# Patient Record
Sex: Female | Born: 1975 | Marital: Single | Smoking: Never smoker
Health system: Southern US, Community
[De-identification: ages and names within clinical notes are randomized; demographics above are authoritative.]

## PROBLEM LIST (undated history)

## (undated) DIAGNOSIS — I208 Other forms of angina pectoris: Secondary | ICD-10-CM

## (undated) DIAGNOSIS — E7800 Pure hypercholesterolemia, unspecified: Secondary | ICD-10-CM

## (undated) DIAGNOSIS — F419 Anxiety disorder, unspecified: Secondary | ICD-10-CM

## (undated) DIAGNOSIS — K760 Fatty (change of) liver, not elsewhere classified: Secondary | ICD-10-CM

## (undated) HISTORY — PX: APPENDECTOMY: SHX54

## (undated) HISTORY — DX: Fatty (change of) liver, not elsewhere classified: K76.0

## (undated) HISTORY — DX: Pure hypercholesterolemia, unspecified: E78.00

## (undated) HISTORY — DX: Anxiety disorder, unspecified: F41.9

## (undated) HISTORY — PX: MANDIBLE FRACTURE SURGERY: SHX706

## (undated) HISTORY — PX: OTHER SURGICAL HISTORY: SHX169

---
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
## 2015-09-05 ENCOUNTER — Other Ambulatory Visit: Payer: Self-pay | Admitting: Neurology

## 2015-09-05 ENCOUNTER — Other Ambulatory Visit: Payer: Self-pay | Admitting: Oral Surgery

## 2015-09-05 DIAGNOSIS — M2740 Unspecified cyst of jaw: Secondary | ICD-10-CM

## 2015-09-12 ENCOUNTER — Ambulatory Visit
Admission: RE | Admit: 2015-09-12 | Discharge: 2015-09-12 | Disposition: A | Discharge status: Home / Self Care | Payer: No Typology Code available for payment source | Source: Ambulatory Visit | Attending: Oral Surgery | Admitting: Oral Surgery

## 2015-09-12 DIAGNOSIS — M2740 Unspecified cyst of jaw: Secondary | ICD-10-CM

## 2015-09-13 ENCOUNTER — Ambulatory Visit: Payer: Self-pay | Attending: Family Medicine

## 2019-03-12 ENCOUNTER — Other Ambulatory Visit: Payer: Self-pay | Admitting: Nurse Practitioner

## 2019-03-12 ENCOUNTER — Ambulatory Visit
Admission: RE | Admit: 2019-03-12 | Discharge: 2019-03-12 | Disposition: A | Discharge status: Home / Self Care | Payer: No Typology Code available for payment source | Source: Ambulatory Visit | Attending: Nurse Practitioner | Admitting: Nurse Practitioner

## 2019-03-12 ENCOUNTER — Other Ambulatory Visit: Payer: Self-pay

## 2019-03-12 DIAGNOSIS — R0602 Shortness of breath: Secondary | ICD-10-CM

## 2019-07-02 ENCOUNTER — Telehealth: Payer: Self-pay

## 2019-07-02 ENCOUNTER — Other Ambulatory Visit: Payer: Self-pay

## 2019-07-02 ENCOUNTER — Encounter: Payer: Self-pay | Admitting: Pulmonary Disease

## 2019-07-02 ENCOUNTER — Ambulatory Visit (INDEPENDENT_AMBULATORY_CARE_PROVIDER_SITE_OTHER): Payer: Self-pay | Admitting: Pulmonary Disease

## 2019-07-02 VITALS — BP 118/88 | HR 90 | Temp 98.0°F | Ht 65.0 in | Wt 181.4 lb

## 2019-07-02 DIAGNOSIS — F410 Panic disorder [episodic paroxysmal anxiety] without agoraphobia: Secondary | ICD-10-CM

## 2019-07-02 DIAGNOSIS — R0602 Shortness of breath: Secondary | ICD-10-CM

## 2019-07-02 DIAGNOSIS — Z8616 Personal history of COVID-19: Secondary | ICD-10-CM

## 2019-07-02 DIAGNOSIS — Z8619 Personal history of other infectious and parasitic diseases: Secondary | ICD-10-CM

## 2019-07-02 NOTE — Telephone Encounter (Signed)
LMTCB  No records have been received. We need to confirm when she was last tested.
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
## 2019-07-02 NOTE — Patient Instructions (Addendum)
Thank you for visiting Dr. Valeta Harms at [facility]. Today we recommend the following:  Orders Placed This Encounter  Procedures  . HRCT ILD - CT CHEST HIGH RESOLUTION  . FULL - Pulmonary Function Test ([facility]BPU)  Return in about 2 months (around 09/01/2019) for with APP or Dr. Valeta Harms.  Please call with any questions.  Please follow up with primary care, Michail Jewels, NP regarding anxiety management     Please do your part to reduce the spread of COVID-19.

## 2019-07-03 ENCOUNTER — Ambulatory Visit (INDEPENDENT_AMBULATORY_CARE_PROVIDER_SITE_OTHER)
Admission: RE | Admit: 2019-07-03 | Discharge: 2019-07-03 | Disposition: A | Discharge status: Home / Self Care | Payer: Self-pay | Source: Ambulatory Visit | Attending: Pulmonary Disease | Admitting: Pulmonary Disease

## 2019-07-03 ENCOUNTER — Encounter: Payer: Self-pay | Admitting: Radiology

## 2019-07-03 DIAGNOSIS — R0602 Shortness of breath: Secondary | ICD-10-CM

## 2019-07-08 ENCOUNTER — Telehealth: Payer: Self-pay | Admitting: Internal Medicine

## 2019-07-08 NOTE — Progress Notes (Signed)
Called and LMTCB via [employer]

## 2019-07-08 NOTE — Telephone Encounter (Signed)
  Advised pt of results. Pt understood and nothing further is needed.    Notes recorded by Garner Nash, DO on 07/03/2019 at 5:06 PM EST  [place], please let her know that her CT scan imaging is normal.   Thanks,   BLI   Garner Nash, DO  [facility]  07/03/2019 5:04 PM

## 2019-07-08 NOTE — Telephone Encounter (Signed)
eRROR 

## 2019-07-14 ENCOUNTER — Other Ambulatory Visit (HOSPITAL_COMMUNITY)
Admission: RE | Admit: 2019-07-14 | Discharge: 2019-07-14 | Disposition: A | Discharge status: Home / Self Care | Payer: Self-pay | Source: Ambulatory Visit | Attending: Pulmonary Disease | Admitting: Pulmonary Disease

## 2019-07-14 DIAGNOSIS — Z01812 Encounter for preprocedural laboratory examination: Secondary | ICD-10-CM | POA: Insufficient documentation

## 2019-07-14 DIAGNOSIS — Z20828 Contact with and (suspected) exposure to other viral communicable diseases: Secondary | ICD-10-CM | POA: Insufficient documentation

## 2019-07-14 LAB — SARS CORONAVIRUS 2 (TAT 6-24 HRS): SARS Coronavirus 2: NEGATIVE

## 2019-07-16 ENCOUNTER — Ambulatory Visit (INDEPENDENT_AMBULATORY_CARE_PROVIDER_SITE_OTHER): Payer: Self-pay | Admitting: Pulmonary Disease

## 2019-07-16 ENCOUNTER — Other Ambulatory Visit: Payer: Self-pay

## 2019-07-16 DIAGNOSIS — R0602 Shortness of breath: Secondary | ICD-10-CM

## 2019-07-16 DIAGNOSIS — Z8619 Personal history of other infectious and parasitic diseases: Secondary | ICD-10-CM

## 2019-07-16 DIAGNOSIS — Z8616 Personal history of COVID-19: Secondary | ICD-10-CM

## 2019-07-16 LAB — PULMONARY FUNCTION TEST
DL/VA % pred: 135 %
DL/VA: 5.86 ml/min/mmHg/L
DLCO unc % pred: 127 %
DLCO unc: 29.27 ml/min/mmHg
FEF 25-75 Post: 4.33 L/sec
FEF 25-75 Pre: 3.4 L/sec
FEF2575-%Change-Post: 27 %
FEF2575-%Pred-Post: 139 %
FEF2575-%Pred-Pre: 109 %
FEV1-%Change-Post: 5 %
FEV1-%Pred-Post: 103 %
FEV1-%Pred-Pre: 98 %
FEV1-Post: 3.25 L
FEV1-Pre: 3.07 L
FEV1FVC-%Change-Post: 4 %
FEV1FVC-%Pred-Pre: 100 %
FEV6-%Change-Post: 0 %
FEV6-%Pred-Post: 99 %
FEV6-%Pred-Pre: 98 %
FEV6-Post: 3.79 L
FEV6-Pre: 3.77 L
FEV6FVC-%Change-Post: 0 %
FEV6FVC-%Pred-Post: 101 %
FEV6FVC-%Pred-Pre: 102 %
FVC-%Change-Post: 0 %
FVC-%Pred-Post: 97 %
FVC-%Pred-Pre: 96 %
FVC-Post: 3.8 L
FVC-Pre: 3.77 L
Post FEV1/FVC ratio: 86 %
Post FEV6/FVC ratio: 100 %
Pre FEV1/FVC ratio: 82 %
Pre FEV6/FVC Ratio: 100 %

## 2019-07-16 NOTE — Progress Notes (Signed)
Spirometry pre and post and Dlco done today. ?

## 2019-07-16 NOTE — Telephone Encounter (Signed)
This has been addressed. Nothing further is needed at this time.  

## 2019-08-28 ENCOUNTER — Ambulatory Visit: Payer: Self-pay | Admitting: Pulmonary Disease

## 2020-02-02 ENCOUNTER — Telehealth: Payer: Self-pay | Admitting: Pulmonary Disease

## 2020-02-02 NOTE — Telephone Encounter (Signed)
Pt was contacted and an appt has been scheduled

## 2020-02-03 ENCOUNTER — Other Ambulatory Visit: Payer: Self-pay

## 2020-02-03 ENCOUNTER — Ambulatory Visit (INDEPENDENT_AMBULATORY_CARE_PROVIDER_SITE_OTHER): Payer: Self-pay | Admitting: Nurse Practitioner

## 2020-02-03 VITALS — HR 98 | Temp 97.6°F | Resp 18

## 2020-02-03 DIAGNOSIS — Z8619 Personal history of other infectious and parasitic diseases: Secondary | ICD-10-CM

## 2020-02-03 DIAGNOSIS — R29898 Other symptoms and signs involving the musculoskeletal system: Secondary | ICD-10-CM

## 2020-02-03 DIAGNOSIS — R519 Headache, unspecified: Secondary | ICD-10-CM

## 2020-02-03 DIAGNOSIS — F411 Generalized anxiety disorder: Secondary | ICD-10-CM

## 2020-02-03 DIAGNOSIS — G8929 Other chronic pain: Secondary | ICD-10-CM

## 2020-02-03 DIAGNOSIS — R0602 Shortness of breath: Secondary | ICD-10-CM

## 2020-02-03 DIAGNOSIS — R251 Tremor, unspecified: Secondary | ICD-10-CM

## 2020-02-03 MED ORDER — BUDESONIDE-FORMOTEROL FUMARATE 80-4.5 MCG/ACT IN AERO: 2.0000 | INHALATION_SPRAY | Freq: Two times a day (BID) | RESPIRATORY_TRACT | 3 refills | Status: AC

## 2020-02-03 MED ORDER — DULOXETINE HCL 30 MG PO CPEP: 30.0000 mg | ORAL_CAPSULE | Freq: Every day | ORAL | 3 refills | Status: DC

## 2020-02-03 NOTE — Patient Instructions (Addendum)
History of Viral Illness - most likely Covid - never tested Shortness of breath:  Will need follow up with pulmonary  Will order Symbicort - 2 puffs twice daily   Headache Weakness to lower extremities Tremors:  Will refer to neurology  Will refer to Neuro Rehab   Anxiety:  Will start on cymbalta   Note: Patient does not have insurance - will give paper work for financial assistance  Follow up:  Follow up in 1 month or sooner if needed

## 2020-02-03 NOTE — Progress Notes (Addendum)
@Patient  ID: Sandra Savage, female    DOB: Sep 18, 1975, 44 y.o.   MRN: [id number]  Chief Complaint  Patient presents with  . Shortness of Breath    thinks she had covid June 2020 - SOB, weakness, tremor, HA,     Referring provider: Garner Nash, DO   44 year old female with history of anxiety and hypercholesteremia.   HPI  Patient presents today for post Covid care clinic visit.  Spanish interpreter was used for the visit.  Patient states that she had Covid in June 2020.  She was never tested for Covid at that time.  There were 3 other family members who were sick with Covid at the same time.  Patient states that since that time she has been having ongoing issues with shortness of breath with exertion, weakness to her bilateral lower extremities, headaches, throbbing pressure to brain/head, and tremors.  Patient states that at times it feels like her legs give away with her and she feels like she is going to fall.  Patient has been seen by pulmonary but did not follow-up.  Patient does have financial concerns and will be given a financial assistance packet today. Denies f/c/s, n/v/d, hemoptysis, PND, chest pain or edema.   Note: Patient walked in office today - O2 sats remained above 98% for entire walk and heart rate was stable.      No Known Allergies   There is no immunization history on file for this patient.  Past Medical History:  Diagnosis Date  . Angina at rest [facility])   . Anxiety   . Hypercholesteremia     Tobacco History: Social History   Tobacco Use  Smoking Status Never Smoker  Smokeless Tobacco Never Used   Counseling given: Not Answered   Outpatient Encounter Medications as of 02/03/2020  Medication Sig  . budesonide-formoterol (SYMBICORT) 80-4.5 MCG/ACT inhaler Inhale 2 puffs into the lungs 2 (two) times daily.  . Cyanocobalamin (B-12 COMPLIANCE INJECTION) 1000 MCG/ML KIT Inject as directed once a week.  . DULoxetine (CYMBALTA) 30 MG capsule Take 1  capsule (30 mg total) by mouth daily.  . hydrOXYzine (ATARAX/VISTARIL) 25 MG tablet Take 25 mg by mouth at bedtime as needed.  . NON FORMULARY Take 500 mg by mouth. 577m CBD Gummie   No facility-administered encounter medications on file as of 02/03/2020.     Review of Systems  Review of Systems  Constitutional: Negative.  Negative for chills and fever.  HENT: Negative.   Respiratory: Positive for shortness of breath. Negative for cough.   Cardiovascular: Negative.  Negative for chest pain, palpitations and leg swelling.  Gastrointestinal: Negative.   Allergic/Immunologic: Negative.   Neurological: Positive for tremors, weakness (bilateral lower extremities) and headaches.  Psychiatric/Behavioral: Negative.        Physical Exam  Pulse 98   Temp 97.6 F (36.4 C)   Resp 18   SpO2 99% Comment: RA  Wt Readings from Last 5 Encounters:  07/02/19 181 lb 6.4 oz (82.3 kg)     Physical Exam Vitals and nursing note reviewed.  Constitutional:      General: She is not in acute distress.    Appearance: She is well-developed.  Cardiovascular:     Rate and Rhythm: Normal rate and regular rhythm.  Pulmonary:     Effort: Pulmonary effort is normal.     Breath sounds: Normal breath sounds.  Musculoskeletal:     Right lower leg: No edema.     Left lower leg:  No edema.  Neurological:     Mental Status: She is alert and oriented to person, place, and time.     Comments: Tremor to bilateral hands noted      Assessment & Plan:   History of viral illness History of Viral Illness - most likely Covid - never tested Shortness of breath:  Will need follow up with pulmonary  Will order Symbicort - 2 puffs twice daily   Headache Weakness to lower extremities Tremors:  Will refer to neurology  Will refer to Neuro Rehab   Anxiety:  Will start on cymbalta   Note: Patient does not have insurance - will give paper work for financial assistance  Follow up:  Follow up  in 1 month or sooner if needed      Fenton Foy, NP 02/04/2020

## 2020-02-04 DIAGNOSIS — R29898 Other symptoms and signs involving the musculoskeletal system: Secondary | ICD-10-CM | POA: Insufficient documentation

## 2020-02-04 DIAGNOSIS — R0602 Shortness of breath: Secondary | ICD-10-CM | POA: Insufficient documentation

## 2020-02-04 DIAGNOSIS — F411 Generalized anxiety disorder: Secondary | ICD-10-CM | POA: Insufficient documentation

## 2020-02-04 DIAGNOSIS — R519 Headache, unspecified: Secondary | ICD-10-CM | POA: Insufficient documentation

## 2020-02-04 DIAGNOSIS — R251 Tremor, unspecified: Secondary | ICD-10-CM | POA: Insufficient documentation

## 2020-02-04 DIAGNOSIS — Z8619 Personal history of other infectious and parasitic diseases: Secondary | ICD-10-CM | POA: Insufficient documentation

## 2020-02-04 DIAGNOSIS — G8929 Other chronic pain: Secondary | ICD-10-CM | POA: Insufficient documentation

## 2020-02-04 NOTE — Assessment & Plan Note (Signed)
History of Viral Illness - most likely Covid - never tested Shortness of breath:  Will need follow up with pulmonary  Will order Symbicort - 2 puffs twice daily   Headache Weakness to lower extremities Tremors:  Will refer to neurology  Will refer to Neuro Rehab   Anxiety:  Will start on cymbalta   Note: Patient does not have insurance - will give paper work for financial assistance  Follow up:  Follow up in 1 month or sooner if needed 

## 2020-02-08 ENCOUNTER — Other Ambulatory Visit: Payer: Self-pay

## 2020-02-08 ENCOUNTER — Encounter: Payer: Self-pay | Admitting: Pulmonary Disease

## 2020-02-08 ENCOUNTER — Ambulatory Visit: Payer: Self-pay | Attending: Nurse Practitioner | Admitting: Rehabilitation

## 2020-02-08 ENCOUNTER — Encounter: Payer: Self-pay | Admitting: Rehabilitation

## 2020-02-08 DIAGNOSIS — R2681 Unsteadiness on feet: Secondary | ICD-10-CM | POA: Insufficient documentation

## 2020-02-08 DIAGNOSIS — M6281 Muscle weakness (generalized): Secondary | ICD-10-CM | POA: Insufficient documentation

## 2020-02-08 DIAGNOSIS — R209 Unspecified disturbances of skin sensation: Secondary | ICD-10-CM | POA: Insufficient documentation

## 2020-02-08 DIAGNOSIS — R2689 Other abnormalities of gait and mobility: Secondary | ICD-10-CM | POA: Insufficient documentation

## 2020-02-08 NOTE — Therapy (Signed)
[facility] [address] [place], [place], [address] Phone: [phone]   Fax:  [phone]  Physical Therapy Evaluation  Patient Details  Name: Sandra Savage MRN: [id number] Date of Birth: 1976/06/27 Referring Provider (PT): Lazaro Arms, NP   Encounter Date: 02/08/2020   PT End of Session - 02/08/20 1359    Visit Number 1    Number of Visits 17   She may only do 1x/wk   Date for PT Re-Evaluation 04/08/20    Authorization Type Financial assistance application given (awaiting approval)    PT Start Time 1113    PT Stop Time 1152    PT Time Calculation (min) 39 min    Activity Tolerance Patient limited by fatigue    Behavior During Therapy [facility] for tasks assessed/performed           Past Medical History:  Diagnosis Date  . Angina at rest [facility])   . Anxiety   . Hypercholesteremia     Past Surgical History:  Procedure Laterality Date  . appendectomy     unknown date per patient   . MANDIBLE FRACTURE SURGERY     domestic     There were no vitals filed for this visit.    Subjective Assessment - 02/08/20 1118    Subjective Per interpreter: "I had Covid last year but I feel my brain pulses or throbs.  I have some tremors in hands, sometimes in left leg, but weakness in both legs.  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  I am desperate."    Patient is accompained by: Interpreter    Pertinent History post covid    Limitations Walking;Standing;Lifting    How long can you stand comfortably? sometimes 10 mins, sometimes an hour, fluctuates    How long can you walk comfortably? about 10 mins    Patient Stated Goals "I want the tremors to go away, go back to work, to be able to recover"    Currently in Pain? No/denies              [facility] PT Assessment - 02/08/20 0001      Assessment   Medical Diagnosis Post Covid debility    Referring Provider (PT) Lazaro Arms, NP    Onset Date/Surgical Date 01/04/19   end of May and beginning of July 2020 (neither confirmed)     Precautions   Precautions Fall      Balance Screen   Has the patient fallen in the past 6 months No    Has the patient had a decrease in activity level because of a fear of falling?  Yes    Is the patient reluctant to leave their home because of a fear of falling?  Yes      [place] Private residence    Living Arrangements Alone;Other relatives   Cousins are in [place] to enter    Entrance Stairs-Number of Steps --   third floor apt, so 40ish stairs   Entrance Stairs-Rails Right;Left;Cannot reach both    Home Layout One level    Home Equipment None      Prior Function   Level of Independence Independent with basic ADLs;Independent with household mobility with device;Independent with household mobility without device;Independent with gait;Independent with transfers    Vocation Full time  employment    ArtistVocation Requirements Stylist     Leisure Movies, going dancing, going to the park, go to the gym      Cognition   Overall Cognitive Status Impaired/Different from baseline    Area of Impairment Memory   concentration, word finding     Sensation   Light Touch Impaired Detail    Light Touch Impaired Details Impaired RUE;Impaired LUE;Impaired RLE;Impaired LLE   knee down in LEs, whole arm   Hot/Cold Appears Intact   cannot discern temp on head     Coordination   Gross Motor Movements are Fluid and Coordinated Yes   in LEs   Fine Motor Movements are Fluid and Coordinated Yes   In LEs   Heel Shin [facility]      Posture/Postural Control   Posture/Postural Control Postural limitations    Postural Limitations Rounded Shoulders;Forward head      ROM / Strength   AROM / PROM / Strength Strength      Strength   Overall Strength Deficits    Overall Strength Comments hip flex 4/5 B, L  knee ext 3+/5, R knee ext 4/5, B knee flex 4/5, B ankle DF 4/5, B ankle PF 4/5 (heel raises B)      Transfers   Transfers Sit to Stand;Stand to Sit    Sit to Stand 6: Modified independent (Device/Increase time)    Five time sit to stand comments  16.56 secs with hands on lap x 3 reps    Stand to Sit 6: Modified independent (Device/Increase time)      Ambulation/Gait   Ambulation/Gait Yes    Ambulation/Gait Assistance 6: Modified independent (Device/Increase time)    Ambulation Distance (Feet) 150 Feet    Assistive device None    Gait Pattern Within Functional Limits    Ambulation Surface Level;Indoor    Gait velocity --   unable to obtain on eval   Stairs Yes    Stairs Assistance 6: Modified independent (Device/Increase time)    Stair Management Technique One rail Right;Alternating pattern;Forwards    Number of Stairs 4    Height of Stairs 6    Gait Comments Pts SaO2 following FGA was 100% with HR at 87      Functional Gait  Assessment   Gait assessed  Yes    Gait Level Surface Walks 20 ft in less than 7 sec but greater than 5.5 sec, uses assistive device, slower speed, mild gait deviations, or deviates 6-10 in outside of the 12 in walkway width.    Change in Gait Speed Able to smoothly change walking speed without loss of balance or gait deviation. Deviate no more than 6 in outside of the 12 in walkway width.    Gait with Horizontal Head Turns Performs head turns smoothly with no change in gait. Deviates no more than 6 in outside 12 in walkway width    Gait with Vertical Head Turns Performs head turns with no change in gait. Deviates no more than 6 in outside 12 in walkway width.    Gait and Pivot Turn Pivot turns safely within 3 sec and stops quickly with no loss of balance.    Step Over Obstacle Is able to step over one shoe box (4.5 in total height) without changing gait speed. No evidence of imbalance.    Gait with Narrow Base of Support Ambulates 4-7 steps.    Gait with Eyes  Closed Walks 20 ft, slow speed, abnormal gait pattern, evidence  for imbalance, deviates 10-15 in outside 12 in walkway width. Requires more than 9 sec to ambulate 20 ft.    Ambulating Backwards Walks 20 ft, uses assistive device, slower speed, mild gait deviations, deviates 6-10 in outside 12 in walkway width.    Steps Alternating feet, must use rail.    Total Score 22    FGA comment: 19-24 = medium risk fall                      Objective measurements completed on examination: See above findings.               PT Education - 02/08/20 1359    Education Details education on evaluation results, POC, goals    Person(s) Educated Patient   through interpreter   Methods Explanation    Comprehension Verbalized understanding            PT Short Term Goals - 02/08/20 1504      PT SHORT TERM GOAL #1   Title Pt will be IND with initial HEP in order to indicate improved function and decreased fall risk.  (Target Date: 03/09/20)    Baseline dependent    Time 4    Period Weeks    Status New    Target Date 03/09/20      PT SHORT TERM GOAL #2   Title Pt will ambulate at mod I level without AD with gait speed of >/=2.62 ft/sec in order to indicate safe community ambulator.    Baseline did not have time to assess    Time 4    Period Weeks    Status New      PT SHORT TERM GOAL #3   Title Pt will improve 5TSS time to </=13 secs without UE support in order to indicate dec fall risk and improved strength.    Baseline 16.56 secs with light UE support on thighs    Time 4    Period Weeks    Status New      PT SHORT TERM GOAL #4   Title Pt will perform and improve distance by 150' in order to indicate improved functional endurance.    Baseline unable to assess    Time 4    Period Weeks    Status New      PT SHORT TERM GOAL #5   Title Pt will improve FGA to >/=25/30 in order to indicate decreased fall risk.    Baseline 22/30    Time 4    Period Weeks    Status  New             PT Long Term Goals - 02/08/20 1508      PT LONG TERM GOAL #1   Title Pt will be IND with final HEP in order to indicate improved functional mobility and balance (Target Date: 04/08/20)    Baseline dependent    Time 8    Period Weeks    Status New    Target Date 04/08/20      PT LONG TERM GOAL #2   Title Pt will ambulate with gait speed >/=3.22 ft/sec in order to indicate more normal gait speed.    Baseline did not have time to assess    Time 8    Period Weeks    Status New      PT LONG TERM GOAL #3   Title Pt will improve distance by 300' from baseline in order to  indicate improved functional endurnace.    Baseline unable to assess on eval    Time 8    Period Weeks    Status New      PT LONG TERM GOAL #4   Title Pt will negotiate up/down 3 flights of stairs with single rail in alternating pattern with no more than 13/20 on RPE scale in order to indicate improved strength and endurance.    Baseline Pt is currently exhausted and needs several breaks when doing stairs.    Time 8    Period Weeks    Status New      PT LONG TERM GOAL #5   Title Pt will improve FGA to >/=28/30 in order to indicate decreased fall risk.    Baseline 22/30    Time 8    Period Weeks    Status New      Additional Long Term Goals   Additional Long Term Goals Yes      PT LONG TERM GOAL #6   Title Pt will tolerate light to moderate standing activity x 30 mins in order to indicate improved endurance and ability to transition back to work.    Baseline pt is not working at this time    Time 8    Period Weeks    Status New                  Plan - 02/08/20 1400    Clinical Impression Statement Pt presents post Covid diagnosis in May of last year then again in June.  Neither case was actually confirmed that I can find in her medical history and last MD note reports that it was not confirmed.  She reports that family members also had Covid around this time.  She presents  with continued weakness, tremors in BUEs/LLE, decreased balance, and poor activity tolerance.  She has not major medical history except for high cholesterol.  Upon PT evaluation, pt demos generalized BLE weakness (esp in hip flexors), decreased gait speed, FGA of 22/30 indicative of moderate fall risk and 5TSS time of 16.56 ses with light UE support indicative of elevated fall risk.  Pt very SOB following gait tasks, however SaO2 was 100% and HR was 87.  Pt will benefit from skilled OP neuro PT in order to address deficits.    Personal Factors and Comorbidities Age;Comorbidity 1;Finances;Time since onset of injury/illness/exacerbation;Profession    Comorbidities post covid    Examination-Activity Limitations Carry;Lift;Stand;Stairs;Squat;Locomotion Level;Transfers    Examination-Participation Restrictions Cleaning;Community Activity;Shop;Meal Prep    Stability/Clinical Decision Making Evolving/Moderate complexity    Clinical Decision Making Moderate    Rehab Potential Good    PT Frequency 2x / week   she may only choose to do 1x.wk   PT Duration 8 weeks    PT Treatment/Interventions ADLs/Self Care Home Management;Aquatic Therapy;DME Instruction;Gait training;Stair training;Functional mobility training;Therapeutic activities;Therapeutic exercise;Balance training;Neuromuscular re-education;Cognitive remediation;Patient/family education;Passive range of motion;Vestibular    PT Next Visit Plan get gait speed (update goal as needed), (update goals if needed)-initiate walking program, initiate HEP for BLE strength, balance    Recommended Other Services I wonder about an OT eval for UE deficits however am unsure since she is awaiting financial assistance?? what do you think??    Consulted and Agree with Plan of Care Patient;Other (Comment)   interpreter          Patient will benefit from skilled therapeutic intervention in order to improve the following deficits and impairments:  Cardiopulmonary  status limiting  activity, Decreased activity tolerance, Decreased balance, Decreased cognition, Decreased endurance, Decreased mobility, Decreased strength, Difficulty walking, Impaired perceived functional ability, Impaired flexibility, Impaired sensation, Postural dysfunction, Improper body mechanics  Visit Diagnosis: Muscle weakness (generalized)  Other abnormalities of gait and mobility  Unspecified disturbances of skin sensation  Unsteadiness on feet     Problem List Patient Active Problem List   Diagnosis Date Noted  . Shortness of breath 02/04/2020  . Generalized anxiety disorder 02/04/2020  . Weakness of both lower extremities 02/04/2020  . Chronic nonintractable headache 02/04/2020  . Tremor of both hands 02/04/2020  . History of viral illness 02/04/2020    Harriet Butte, PT, MPT [facility] [address] [place], [place], [address] Phone: [phone]   Fax:  [phone] 02/08/20, 3:16 PM  Name: Sandra Savage MRN: [id number] Date of Birth: [birth year]

## 2020-02-11 DIAGNOSIS — B342 Coronavirus infection, unspecified: Secondary | ICD-10-CM

## 2020-02-11 HISTORY — DX: Coronavirus infection, unspecified: B34.2

## 2020-02-17 ENCOUNTER — Other Ambulatory Visit: Payer: Self-pay

## 2020-02-17 ENCOUNTER — Encounter: Payer: Self-pay | Admitting: Rehabilitation

## 2020-02-17 ENCOUNTER — Ambulatory Visit: Payer: Self-pay | Attending: Nurse Practitioner | Admitting: Rehabilitation

## 2020-02-17 DIAGNOSIS — M6281 Muscle weakness (generalized): Secondary | ICD-10-CM | POA: Insufficient documentation

## 2020-02-17 DIAGNOSIS — R2681 Unsteadiness on feet: Secondary | ICD-10-CM | POA: Insufficient documentation

## 2020-02-17 DIAGNOSIS — R209 Unspecified disturbances of skin sensation: Secondary | ICD-10-CM | POA: Insufficient documentation

## 2020-02-17 DIAGNOSIS — R2689 Other abnormalities of gait and mobility: Secondary | ICD-10-CM | POA: Insufficient documentation

## 2020-02-17 NOTE — Therapy (Signed)
[facility] [address] [place], [place], [address] Phone: [phone]   Fax:  [phone]  Physical Therapy Treatment  Patient Details  Name: Sandra Savage MRN: [id number] Date of Birth: 03-29-1976 Referring Provider (PT): Angus Seller, NP   Encounter Date: 02/17/2020   PT End of Session - 02/17/20 1024    Visit Number 2    Number of Visits 17    Date for PT Re-Evaluation 04/08/20    Authorization Type Financial assistance application given (awaiting approval)    PT Start Time [phone]    PT Stop Time 0930    PT Time Calculation (min) 44 min    Activity Tolerance Patient limited by fatigue    Behavior During Therapy [facility] for tasks assessed/performed           Past Medical History:  Diagnosis Date   Angina at rest [facility])    Anxiety    Hypercholesteremia     Past Surgical History:  Procedure Laterality Date   appendectomy     unknown date per patient    MANDIBLE FRACTURE SURGERY     domestic     There were no vitals filed for this visit.   Subjective Assessment - 02/17/20 0848    Subjective No changes to report via interpreter.    Pertinent History post covid    Limitations Walking;Standing;Lifting    How long can you stand comfortably? sometimes 10 mins, sometimes an hour, fluctuates    How long can you walk comfortably? about 10 mins    Patient Stated Goals "I want the tremors to go away, go back to work, to be able to recover"    Currently in Pain? No/denies              [facility] PT Assessment - 02/17/20 0851      Ambulation/Gait   Ambulation/Gait Yes    Ambulation/Gait Assistance 6: Modified independent (Device/Increase time)    Ambulation Distance (Feet) 1237 Feet   plus 100' within session   Assistive device None    Gait Pattern Within Functional Limits    Ambulation Surface Level;Indoor    Gait velocity 3.46 ft/sec without AD      6 Minute Walk- Baseline   6 Minute Walk- Baseline  yes    BP (mmHg) 127/76    HR (bpm) 85    02 Sat (%RA) 97 %    Modified Borg Scale for Dyspnea 3- Moderate shortness of breath or breathing difficulty    Perceived Rate of Exertion (Borg) 11- Fairly light      6 Minute walk- Post Test   6 Minute Walk Post Test yes    BP (mmHg) 101/78    HR (bpm) 104    02 Sat (%RA) 95 %    Modified Borg Scale for Dyspnea 4- somewhat severe    Perceived Rate of Exertion (Borg) 19-Very, very hard      6 minute walk test results    Aerobic Endurance Distance Walked 1237    Endurance additional comments without AD, without rest break, some dizziness noted       High Level Balance   High Level Balance Comments see pt instructions for exercises             Walking Program:  Educated to begin with bouts of 4-5 mins (timing) indoors vs outdoors (when cooler and on flat/paved surfaces if able) and increasing time 1 min over 1 week period as tolerated.  Also educated on  landmarks if that is easier.  For example, 1-2 trips to mailbox made me feel how I felt post 6MWT or close, then begin there and work her way up.  Pt verbalized understanding via interpreter.      Access Code: [email or web address] URL: [email or web address] Date: 02/17/2020 Prepared by: Harriet ButteEmily Francy Mcilvaine  Exercises Seated Shoulder Rolls - 1 x daily - 5-7 x weekly - 2 sets - 10 reps Walking March - 1 x daily - 7 x weekly - 1 sets - 4 reps Tandem Walking with Counter Support - 1 x daily - 7 x weekly - 1 sets - 4 reps Mini Squat with Counter Support - 1 x daily - 7 x weekly - 1 sets - 10 reps               PT Education - 02/17/20 1024    Education Details HEP, walking program    Person(s) Educated Patient   via ipad interpreter   Methods Explanation;Demonstration;Handout    Comprehension Verbalized understanding;Returned demonstration;Need further instruction            PT Short Term Goals - 02/08/20 1504      PT SHORT TERM GOAL #1   Title Pt will be IND with  initial HEP in order to indicate improved function and decreased fall risk.  (Target Date: 03/09/20)    Baseline dependent    Time 4    Period Weeks    Status New    Target Date 03/09/20      PT SHORT TERM GOAL #2   Title Pt will ambulate at mod I level without AD with gait speed of >/=2.62 ft/sec in order to indicate safe community ambulator.    Baseline did not have time to assess    Time 4    Period Weeks    Status New      PT SHORT TERM GOAL #3   Title Pt will improve 5TSS time to </=13 secs without UE support in order to indicate dec fall risk and improved strength.    Baseline 16.56 secs with light UE support on thighs    Time 4    Period Weeks    Status New      PT SHORT TERM GOAL #4   Title Pt will perform 6MWT and improve distance by 150' in order to indicate improved functional endurance.    Baseline unable to assess    Time 4    Period Weeks    Status New      PT SHORT TERM GOAL #5   Title Pt will improve FGA to >/=25/30 in order to indicate decreased fall risk.    Baseline 22/30    Time 4    Period Weeks    Status New             PT Long Term Goals - 02/08/20 1508      PT LONG TERM GOAL #1   Title Pt will be IND with final HEP in order to indicate improved functional mobility and balance (Target Date: 04/08/20)    Baseline dependent    Time 8    Period Weeks    Status New    Target Date 04/08/20      PT LONG TERM GOAL #2   Title Pt will ambulate with gait speed >/=3.22 ft/sec in order to indicate more normal gait speed.    Baseline did not have time to assess    Time 8    Period Weeks  Status New      PT LONG TERM GOAL #3   Title Pt will improve distance by 300' from baseline in order to indicate improved functional endurnace.    Baseline unable to assess on eval    Time 8    Period Weeks    Status New      PT LONG TERM GOAL #4   Title Pt will negotiate up/down 3 flights of stairs with single rail in alternating pattern with no more  than 13/20 on RPE scale in order to indicate improved strength and endurance.    Baseline Pt is currently exhausted and needs several breaks when doing stairs.    Time 8    Period Weeks    Status New      PT LONG TERM GOAL #5   Title Pt will improve FGA to >/=28/30 in order to indicate decreased fall risk.    Baseline 22/30    Time 8    Period Weeks    Status New      Additional Long Term Goals   Additional Long Term Goals Yes      PT LONG TERM GOAL #6   Title Pt will tolerate light to moderate standing activity x 30 mins in order to indicate improved endurance and ability to transition back to work.    Baseline pt is not working at this time    Time 8    Period Weeks    Status New                 Plan - 02/17/20 1025    Clinical Impression Statement Skilled session focused on formal assessment of functional endurance via .  Note distance of 1237' without device or seated rest breaks and vitals WFL, however this distance is greatly below normal for her age and also pt with max fatigue and mild dizziness following task.  Also note that gait speed is 3.46 ft/sec which is safe for community ambulator, however below normal for her age group and with fatigue.  Pt needs max cues throughout functional task for pursed lip breathing and also to relax shoulders.  Remainder of session initiated HEP for walking program (via interpreter) and HEP (in [place]).  Pt tolerated all very well.    Personal Factors and Comorbidities Age;Comorbidity 1;Finances;Time since onset of injury/illness/exacerbation;Profession    Comorbidities post covid    Examination-Activity Limitations Carry;Lift;Stand;Stairs;Squat;Locomotion Level;Transfers    Examination-Participation Restrictions Cleaning;Community Activity;Shop;Meal Prep    Stability/Clinical Decision Making Evolving/Moderate complexity    Rehab Potential Good    PT Frequency 2x / week    PT Duration 8 weeks    PT Treatment/Interventions  ADLs/Self Care Home Management;Aquatic Therapy;DME Instruction;Gait training;Stair training;Functional mobility training;Therapeutic activities;Therapeutic exercise;Balance training;Neuromuscular re-education;Cognitive remediation;Patient/family education;Passive range of motion;Vestibular    PT Next Visit Plan See how walking program is going (we talked about doing 4-5 mins for 1-2 weeks or landmarks if outdoors-when not hot (ex to/from mailbox)).  Continue high level balance and endurance (seated stepper/nustep would be good maybe treadmil).    Consulted and Agree with Plan of Care Patient;Other (Comment)           Patient will benefit from skilled therapeutic intervention in order to improve the following deficits and impairments:  Cardiopulmonary status limiting activity, Decreased activity tolerance, Decreased balance, Decreased cognition, Decreased endurance, Decreased mobility, Decreased strength, Difficulty walking, Impaired perceived functional ability, Impaired flexibility, Impaired sensation, Postural dysfunction, Improper body mechanics  Visit Diagnosis: Muscle weakness (generalized)  Other abnormalities of gait and mobility  Unspecified disturbances of skin sensation  Unsteadiness on feet     Problem List Patient Active Problem List   Diagnosis Date Noted   Shortness of breath 02/04/2020   Generalized anxiety disorder 02/04/2020   Weakness of both lower extremities 02/04/2020   Chronic nonintractable headache 02/04/2020   Tremor of both hands 02/04/2020   History of viral illness 02/04/2020    Harriet Butte, PT, MPT [facility] [address] [place], [place], [address] Phone: [phone]   Fax:  [phone] 02/17/20, 10:31 AM  Name: Sandra Savage MRN: [id number] Date of Birth: [birth year]

## 2020-02-17 NOTE — Patient Instructions (Signed)
Access Code: [email or web address] URL: [email or web address] Date: 02/17/2020 Prepared by: Harriet Butte  Exercises Seated Shoulder Rolls - 1 x daily - 5-7 x weekly - 2 sets - 10 reps Walking March - 1 x daily - 7 x weekly - 1 sets - 4 reps Tandem Walking with Counter Support - 1 x daily - 7 x weekly - 1 sets - 4 reps Mini Squat with Counter Support - 1 x daily - 7 x weekly - 1 sets - 10 reps

## 2020-02-24 ENCOUNTER — Encounter: Payer: Self-pay | Admitting: Rehabilitation

## 2020-02-24 ENCOUNTER — Other Ambulatory Visit: Payer: Self-pay

## 2020-02-24 ENCOUNTER — Ambulatory Visit: Payer: Self-pay | Admitting: Rehabilitation

## 2020-02-24 DIAGNOSIS — R2681 Unsteadiness on feet: Secondary | ICD-10-CM

## 2020-02-24 DIAGNOSIS — R2689 Other abnormalities of gait and mobility: Secondary | ICD-10-CM

## 2020-02-24 DIAGNOSIS — R209 Unspecified disturbances of skin sensation: Secondary | ICD-10-CM

## 2020-02-24 DIAGNOSIS — M6281 Muscle weakness (generalized): Secondary | ICD-10-CM

## 2020-02-24 NOTE — Patient Instructions (Signed)
Access Code: [email or web address] URL: [email or web address] Date: 02/24/2020 Prepared by: Harriet Butte  Exercises Seated Shoulder Rolls - 1 x daily - 5-7 x weekly - 2 sets - 10 reps Walking March - 1 x daily - 7 x weekly - 1 sets - 4 reps Tandem Walking with Counter Support - 1 x daily - 7 x weekly - 1 sets - 4 reps Mini Squat with Counter Support - 1 x daily - 7 x weekly - 1 sets - 10 reps Sit to Stand without Arm Support - 1 x daily - 7 x weekly - 3 sets - 10 reps Romberg Stance Eyes Closed on Foam Pad - 1 x daily - 7 x weekly - 3 sets - 10 reps Single Leg Stance on Foam Pad - 1 x daily - 7 x weekly - 3 sets - 10 reps Tandem Stance on Foam Pad with Eyes Open - 1 x daily - 7 x weekly - 3 sets - 10 reps

## 2020-02-24 NOTE — Therapy (Signed)
[facility], [place] [address] [place], [place], [address] Phone: [phone]   Fax:  [phone]  Physical Therapy Treatment  Patient Details  Name: Sandra Savage MRN: [id number] Date of Birth: 29-Jun-1976 Referring Provider (PT): Angus Seller, NP   Encounter Date: 02/24/2020   PT End of Session - 02/24/20 1025    Visit Number 3    Number of Visits 17    Date for PT Re-Evaluation 04/08/20    Authorization Type Financial assistance application given (awaiting approval)    PT Start Time 1019    PT Stop Time 1100    PT Time Calculation (min) 41 min    Activity Tolerance Patient limited by fatigue    Behavior During Therapy [facility] for tasks assessed/performed           Past Medical History:  Diagnosis Date  . Angina at rest [facility])   . Anxiety   . Hypercholesteremia     Past Surgical History:  Procedure Laterality Date  . appendectomy     unknown date per patient   . MANDIBLE FRACTURE SURGERY     domestic     There were no vitals filed for this visit.   Subjective Assessment - 02/24/20 1023    Subjective No changes, no falls via interpreter. Reports she is doing exercises 4 times per day. Also walking 10 mins, 3 times per day.    Pertinent History post covid    Limitations Walking;Standing;Lifting    How long can you stand comfortably? sometimes 10 mins, sometimes an hour, fluctuates    How long can you walk comfortably? about 10 mins    Patient Stated Goals "I want the tremors to go away, go back to work, to be able to recover"    Currently in Pain? No/denies                             [facility] Adult PT Treatment/Exercise - 02/24/20 1042      Ambulation/Gait   Ambulation/Gait Yes    Ambulation/Gait Assistance 6: Modified independent (Device/Increase time)    Ambulation Distance (Feet) 300 Feet   total throughout session   Assistive device None    Gait Pattern Within Functional Limits     Ambulation Surface Level;Unlevel    Gait Comments SaO2 following stepper was 95% and HR was 88      Neuro Re-ed    Neuro Re-ed Details  in // bars:  standing on airex-feet apart EO x 15 secs>EO with head turns side/side and up/down x 10 reps>EC x 20 secs.  Feet together-EO with head turns side/side and up/down x 10 reps and EC x 2 sets of 20 secs (added last to HEP), SLS on airex x 2 sets of 20 secs on each side, tandem x 20 secs (2 sets each foot)-added to HEP.       Exercises   Exercises Knee/Hip      Knee/Hip Exercises: Aerobic   Stepper Scifit stepper x 5 mins at level 1.5 resistance with BUEs/LEs with rpms initially in 30s with cues to increase and maintain at 50-60.  Pt moderately fatigued but vitals WFL.              Access Code: [email or web address] URL: [email or web address] Date: 02/24/2020 Prepared by: Harriet Butte  Exercises Seated Shoulder Rolls - 1 x daily - 5-7 x weekly - 2 sets - 10 reps Walking March - 1 x daily - 7  x weekly - 1 sets - 4 reps Tandem Walking with Counter Support - 1 x daily - 7 x weekly - 1 sets - 4 reps Mini Squat with Counter Support - 1 x daily - 7 x weekly - 1 sets - 10 reps Sit to Stand without Arm Support - 1 x daily - 7 x weekly - 3 sets - 10 reps Romberg Stance Eyes Closed on Foam Pad - 1 x daily - 7 x weekly - 3 sets - 10 reps Single Leg Stance on Foam Pad - 1 x daily - 7 x weekly - 3 sets - 10 reps Tandem Stance on Foam Pad with Eyes Open - 1 x daily - 7 x weekly - 3 sets - 10 reps       PT Education - 02/24/20 1552    Education Details Additions to HEP, doing HEP only 2-3 times per house and ambulating 10 mins only 1-2 times per day    Person(s) Educated Patient    Methods Explanation;Demonstration;Handout    Comprehension Verbalized understanding;Returned demonstration            PT Short Term Goals - 02/08/20 1504      PT SHORT TERM GOAL #1   Title Pt will be IND with initial HEP in order to indicate improved function  and decreased fall risk.  (Target Date: 03/09/20)    Baseline dependent    Time 4    Period Weeks    Status New    Target Date 03/09/20      PT SHORT TERM GOAL #2   Title Pt will ambulate at mod I level without AD with gait speed of >/=2.62 ft/sec in order to indicate safe community ambulator.    Baseline did not have time to assess    Time 4    Period Weeks    Status New      PT SHORT TERM GOAL #3   Title Pt will improve 5TSS time to </=13 secs without UE support in order to indicate dec fall risk and improved strength.    Baseline 16.56 secs with light UE support on thighs    Time 4    Period Weeks    Status New      PT SHORT TERM GOAL #4   Title Pt will perform and improve distance by 150' in order to indicate improved functional endurance.    Baseline unable to assess    Time 4    Period Weeks    Status New      PT SHORT TERM GOAL #5   Title Pt will improve FGA to >/=25/30 in order to indicate decreased fall risk.    Baseline 22/30    Time 4    Period Weeks    Status New             PT Long Term Goals - 02/08/20 1508      PT LONG TERM GOAL #1   Title Pt will be IND with final HEP in order to indicate improved functional mobility and balance (Target Date: 04/08/20)    Baseline dependent    Time 8    Period Weeks    Status New    Target Date 04/08/20      PT LONG TERM GOAL #2   Title Pt will ambulate with gait speed >/=3.22 ft/sec in order to indicate more normal gait speed.    Baseline did not have time to assess    Time 8  Period Weeks    Status New      PT LONG TERM GOAL #3   Title Pt will improve distance by 300' from baseline in order to indicate improved functional endurnace.    Baseline unable to assess on eval    Time 8    Period Weeks    Status New      PT LONG TERM GOAL #4   Title Pt will negotiate up/down 3 flights of stairs with single rail in alternating pattern with no more than 13/20 on RPE scale in order to indicate improved  strength and endurance.    Baseline Pt is currently exhausted and needs several breaks when doing stairs.    Time 8    Period Weeks    Status New      PT LONG TERM GOAL #5   Title Pt will improve FGA to >/=28/30 in order to indicate decreased fall risk.    Baseline 22/30    Time 8    Period Weeks    Status New      Additional Long Term Goals   Additional Long Term Goals Yes      PT LONG TERM GOAL #6   Title Pt will tolerate light to moderate standing activity x 30 mins in order to indicate improved endurance and ability to transition back to work.    Baseline pt is not working at this time    Time 8    Period Weeks    Status New                 Plan - 02/24/20 1553    Clinical Impression Statement Skilled session focused on additonal strength and balance exercises to add to HEP to challenge on more compliant surfaces and also in SLS.  Pt did very well, but continues to be limited with poor endurance.    Personal Factors and Comorbidities Age;Comorbidity 1;Finances;Time since onset of injury/illness/exacerbation;Profession    Comorbidities post covid    Examination-Activity Limitations Carry;Lift;Stand;Stairs;Squat;Locomotion Level;Transfers    Examination-Participation Restrictions Cleaning;Community Activity;Shop;Meal Prep    Stability/Clinical Decision Making Evolving/Moderate complexity    Rehab Potential Good    PT Frequency 2x / week    PT Duration 8 weeks    PT Treatment/Interventions ADLs/Self Care Home Management;Aquatic Therapy;DME Instruction;Gait training;Stair training;Functional mobility training;Therapeutic activities;Therapeutic exercise;Balance training;Neuromuscular re-education;Cognitive remediation;Patient/family education;Passive range of motion;Vestibular    PT Next Visit Plan Continue high level balance, endurance, high level gait/balance    Consulted and Agree with Plan of Care Patient;Other (Comment)           Patient will benefit from skilled  therapeutic intervention in order to improve the following deficits and impairments:  Cardiopulmonary status limiting activity, Decreased activity tolerance, Decreased balance, Decreased cognition, Decreased endurance, Decreased mobility, Decreased strength, Difficulty walking, Impaired perceived functional ability, Impaired flexibility, Impaired sensation, Postural dysfunction, Improper body mechanics  Visit Diagnosis: Muscle weakness (generalized)  Other abnormalities of gait and mobility  Unspecified disturbances of skin sensation  Unsteadiness on feet     Problem List Patient Active Problem List   Diagnosis Date Noted  . Shortness of breath 02/04/2020  . Generalized anxiety disorder 02/04/2020  . Weakness of both lower extremities 02/04/2020  . Chronic nonintractable headache 02/04/2020  . Tremor of both hands 02/04/2020  . History of viral illness 02/04/2020    Harriet Butte, PT, MPT [facility] [address] [place], [place], [address] Phone: [phone]   Fax:  [phone]  02/24/20, 3:55 PM  Name: Loleta ChanceMargarita Cabrera MRN: [id number] Date of Birth: 06/09/1976

## 2020-02-26 ENCOUNTER — Ambulatory Visit: Payer: Self-pay | Admitting: Pulmonary Disease

## 2020-03-02 ENCOUNTER — Ambulatory Visit: Payer: Self-pay | Admitting: Rehabilitation

## 2020-03-04 ENCOUNTER — Other Ambulatory Visit: Payer: Self-pay

## 2020-03-04 ENCOUNTER — Ambulatory Visit (INDEPENDENT_AMBULATORY_CARE_PROVIDER_SITE_OTHER): Payer: Self-pay | Admitting: Nurse Practitioner

## 2020-03-04 VITALS — BP 110/62 | HR 75 | Temp 97.3°F | Wt 186.0 lb

## 2020-03-04 DIAGNOSIS — R0602 Shortness of breath: Secondary | ICD-10-CM

## 2020-03-04 DIAGNOSIS — Z8619 Personal history of other infectious and parasitic diseases: Secondary | ICD-10-CM

## 2020-03-04 DIAGNOSIS — Z20822 Contact with and (suspected) exposure to covid-19: Secondary | ICD-10-CM

## 2020-03-04 DIAGNOSIS — G9331 Postviral fatigue syndrome: Secondary | ICD-10-CM

## 2020-03-04 DIAGNOSIS — F411 Generalized anxiety disorder: Secondary | ICD-10-CM

## 2020-03-04 DIAGNOSIS — G933 Postviral fatigue syndrome: Secondary | ICD-10-CM

## 2020-03-04 MED ORDER — OMEPRAZOLE 20 MG PO CPDR: 20.0000 mg | DELAYED_RELEASE_CAPSULE | Freq: Every day | ORAL | 3 refills | Status: DC

## 2020-03-04 MED ORDER — HYDROXYZINE HCL 25 MG PO TABS: 25.0000 mg | ORAL_TABLET | Freq: Every evening | ORAL | 0 refills | Status: DC | PRN

## 2020-03-04 MED ORDER — PREDNISONE 20 MG PO TABS: 20.0000 mg | ORAL_TABLET | Freq: Every day | ORAL | 0 refills | Status: AC

## 2020-03-04 NOTE — Assessment & Plan Note (Signed)
History of Viral Illness - most likely Covid - never tested - will check antibodies Shortness of breath:  Please keep upcoming appointment with pulmonary  Continue Symbicort - 2 puffs twice daily  Can not tolerate albuterol  Will order Prilosec  Will order prednisone   Headache Weakness to lower extremities Tremors:  Will refer to neurology  Continue Neuro Rehab  May start Magnesium   Anxiety:  Will place referral to psychiatry  Could not tolerate Cymbalta  Will order hydroxyzine      Follow up:  Follow up in 1 month or sooner if needed

## 2020-03-04 NOTE — Progress Notes (Signed)
_0  ID: Sandra Savage, female    DOB: [birth year], 44 y.o.   MRN: [id number]  Chief Complaint  Patient presents with  . Follow-up    Feels like she has not gotten better. Still fatigue, SOB, blurry vision.     Referring provider: Garner Nash, DO   44 year old female with history of anxiety and hypercholesteremia.   HPI  Patient presents today for post COVID care clinic visit follow-up.  Spanish interpreter was used for visit.  Patient states that she had Covid in June 2020 but was never tested.  There were 3 other family members who were sick with Covid at the same time.  Patient states that she does not feel like she has improved since last visit.  She still complains of fatigue, shortness of breath with exertion, and anxiety.  She has started physical therapy.  She has not been to see neurology.  Patient was ordered Cymbalta at last visit but states that she could not tolerate this she felt like it made her heart race.  Patient does still complain of anxiety.  Patient did start Symbicort but has not noticed a difference with this.  She did not take albuterol due to previous reaction.  She states that it felt like it made her heart race.  Patient does have an appointment scheduled with pulmonary in the near future.  Patient also complains today of continued weakness to her lower extremities even though she is participating in physical therapy, headaches, and tremors. Denies f/c/s, n/v/d, hemoptysis, PND, chest pain or edema.      No Known Allergies   There is no immunization history on file for this patient.  Past Medical History:  Diagnosis Date  . Angina at rest [facility])   . Anxiety   . Hypercholesteremia     Tobacco History: Social History   Tobacco Use  Smoking Status Never Smoker  Smokeless Tobacco Never Used   Counseling given: Yes   Outpatient Encounter Medications as of 03/04/2020  Medication Sig  . budesonide-formoterol (SYMBICORT) 80-4.5 MCG/ACT  inhaler Inhale 2 puffs into the lungs 2 (two) times daily.  . Cyanocobalamin (B-12 COMPLIANCE INJECTION) 1000 MCG/ML KIT Inject as directed once a week.  . gabapentin (NEURONTIN) 100 MG capsule Take 200 mg by mouth daily.  . hydrOXYzine (ATARAX/VISTARIL) 25 MG tablet Take 1 tablet (25 mg total) by mouth at bedtime as needed.  . [DISCONTINUED] hydrOXYzine (ATARAX/VISTARIL) 25 MG tablet Take 25 mg by mouth at bedtime as needed.   . DULoxetine (CYMBALTA) 30 MG capsule Take 1 capsule (30 mg total) by mouth daily. (Patient not taking: Reported on 02/08/2020)  . NON FORMULARY Take 500 mg by mouth. 590m CBD Gummie (Patient not taking: Reported on 02/08/2020)  . omeprazole (PRILOSEC) 20 MG capsule Take 1 capsule (20 mg total) by mouth daily.  . [EXPIRED] predniSONE (DELTASONE) 20 MG tablet Take 1 tablet (20 mg total) by mouth daily with breakfast for 5 days.   No facility-administered encounter medications on file as of 03/04/2020.     Review of Systems  Review of Systems  Constitutional: Positive for fatigue. Negative for fever.  Respiratory: Positive for shortness of breath. Negative for cough and wheezing.   Cardiovascular: Negative for chest pain, palpitations and leg swelling.  Neurological: Positive for tremors and weakness (lower extremities).       Physical Exam  BP (!) 110/62   Pulse 75   Temp (!) 97.3 F (36.3 C)   Wt 186 lb 0.1  oz (84.4 kg)   SpO2 99%   BMI 30.95 kg/m   Wt Readings from Last 5 Encounters:  03/04/20 186 lb 0.1 oz (84.4 kg)  07/02/19 181 lb 6.4 oz (82.3 kg)     Physical Exam Vitals and nursing note reviewed.  Constitutional:      General: She is not in acute distress.    Appearance: She is well-developed.  Cardiovascular:     Rate and Rhythm: Normal rate and regular rhythm.  Pulmonary:     Effort: Pulmonary effort is normal.     Breath sounds: Normal breath sounds.  Musculoskeletal:     Right lower leg: No edema.     Left lower leg: No edema.   Neurological:     Mental Status: She is alert and oriented to person, place, and time.  Psychiatric:        Mood and Affect: Mood normal.        Behavior: Behavior normal.        Assessment & Plan:   History of viral illness History of Viral Illness - most likely Covid - never tested - will check antibodies Shortness of breath:  Please keep upcoming appointment with pulmonary  Continue Symbicort - 2 puffs twice daily  Can not tolerate albuterol  Will order Prilosec  Will order prednisone   Headache Weakness to lower extremities Tremors:  Will refer to neurology  Continue Neuro Rehab  May start Magnesium   Anxiety:  Will place referral to psychiatry  Could not tolerate Cymbalta  Will order hydroxyzine      Follow up:  Follow up in 1 month or sooner if needed     Fenton Foy, NP 03/10/2020

## 2020-03-04 NOTE — Patient Instructions (Addendum)
History of Viral Illness - most likely Covid - never tested - will check antibodies Shortness of breath:  Please keep upcoming appointment with pulmonary  Continue Symbicort - 2 puffs twice daily  Can not tolerate albuterol  Will order Prilosec  Will order prednisone   Headache Weakness to lower extremities Tremors:  Will refer to neurology  Continue Neuro Rehab  May start Magnesium   Anxiety:  Will place referral to psychiatry  Could not tolerate Cymbalta  Will order hydroxyzine      Follow up:  Follow up in 1 month or sooner if needed 

## 2020-03-08 LAB — TSH: TSH: 3.34 u[IU]/mL (ref 0.450–4.500)

## 2020-03-08 LAB — COMPREHENSIVE METABOLIC PANEL
ALT: 13 IU/L (ref 0–32)
AST: 11 IU/L (ref 0–40)
Albumin/Globulin Ratio: 1.7 (ref 1.2–2.2)
Albumin: 4.5 g/dL (ref 3.8–4.8)
Alkaline Phosphatase: 99 IU/L (ref 48–121)
BUN/Creatinine Ratio: 13 (ref 9–23)
BUN: 10 mg/dL (ref 6–24)
Bilirubin Total: 0.6 mg/dL (ref 0.0–1.2)
CO2: 18 mmol/L — ABNORMAL LOW (ref 20–29)
Calcium: 9.7 mg/dL (ref 8.7–10.2)
Chloride: 105 mmol/L (ref 96–106)
Creatinine, Ser: 0.76 mg/dL (ref 0.57–1.00)
GFR calc Af Amer: 110 mL/min/{1.73_m2} (ref >59)
GFR calc non Af Amer: 95 mL/min/{1.73_m2} (ref >59)
Globulin, Total: 2.6 g/dL (ref 1.5–4.5)
Glucose: 90 mg/dL (ref 65–99)
Potassium: 4.2 mmol/L (ref 3.5–5.2)
Sodium: 140 mmol/L (ref 134–144)
Total Protein: 7.1 g/dL (ref 6.0–8.5)

## 2020-03-08 LAB — CBC
Hematocrit: 42.7 % (ref 34.0–46.6)
Hemoglobin: 14.3 g/dL (ref 11.1–15.9)
MCH: 27.6 pg (ref 26.6–33.0)
MCHC: 33.5 g/dL (ref 31.5–35.7)
MCV: 82 fL (ref 79–97)
Platelets: 292 10*3/uL (ref 150–450)
RBC: 5.18 x10E6/uL (ref 3.77–5.28)
RDW: 12.7 % (ref 11.7–15.4)
WBC: 7.3 10*3/uL (ref 3.4–10.8)

## 2020-03-08 LAB — VITAMIN B12: Vitamin B-12: >2000 pg/mL — ABNORMAL HIGH (ref 232–1245)

## 2020-03-08 LAB — VITAMIN D 25 HYDROXY (VIT D DEFICIENCY, FRACTURES): Vit D, 25-Hydroxy: 29.8 ng/mL — ABNORMAL LOW (ref 30.0–100.0)

## 2020-03-08 LAB — SAR COV2 SEROLOGY (COVID19)AB(IGG),IA: DiaSorin SARS-CoV-2 Ab, IgG: POSITIVE

## 2020-03-09 ENCOUNTER — Ambulatory Visit: Payer: Self-pay | Admitting: Physical Therapy

## 2020-03-09 ENCOUNTER — Encounter: Payer: Self-pay | Admitting: Physical Therapy

## 2020-03-09 ENCOUNTER — Other Ambulatory Visit: Payer: Self-pay

## 2020-03-09 DIAGNOSIS — R2689 Other abnormalities of gait and mobility: Secondary | ICD-10-CM

## 2020-03-09 DIAGNOSIS — M6281 Muscle weakness (generalized): Secondary | ICD-10-CM

## 2020-03-09 DIAGNOSIS — R209 Unspecified disturbances of skin sensation: Secondary | ICD-10-CM

## 2020-03-10 NOTE — Therapy (Signed)
[facility] [address] [place], [place], [address] Phone: [phone]   Fax:  [phone]  Physical Therapy Treatment  Patient Details  Name: Sandra Savage MRN: [id number] Date of Birth: 02-Dec-1975 Referring Provider (PT): Lazaro Arms, NP   Encounter Date: 03/09/2020   PT End of Session - 03/09/20 1024    Visit Number 4    Number of Visits 17    Date for PT Re-Evaluation 04/08/20    Authorization Type Financial assistance application given (awaiting approval)    PT Start Time 1018    PT Stop Time 1100    PT Time Calculation (min) 42 min    Equipment Utilized During Treatment Gait belt    Activity Tolerance Patient limited by fatigue;Patient tolerated treatment well    Behavior During Therapy [facility] for tasks assessed/performed           Past Medical History:  Diagnosis Date  . Angina at rest [facility])   . Anxiety   . Hypercholesteremia     Past Surgical History:  Procedure Laterality Date  . appendectomy     unknown date per patient   . MANDIBLE FRACTURE SURGERY     domestic     There were no vitals filed for this visit.   Subjective Assessment - 03/09/20 1023    Subjective No new complaints. No falls or pain to report. No changes in her energy.    Patient is accompained by: Interpreter   Minus Liberty ID# [id number]   Pertinent History post covid    Limitations Walking;Standing;Lifting    How long can you stand comfortably? sometimes 10 mins, sometimes an hour, fluctuates    How long can you walk comfortably? about 10 mins    Patient Stated Goals "I want the tremors to go away, go back to work, to be able to recover"    Currently in Pain? No/denies              [facility] PT Assessment - 03/09/20 1028      Transfers   Transfers Sit to Stand;Stand to Sit    Sit to Stand 6: Modified independent (Device/Increase time)    Five time sit to stand comments  14.91 sec's with hands on thighs from standard height  surface.    Stand to Sit 6: Modified independent (Device/Increase time)      Ambulation/Gait   Ambulation/Gait Yes    Ambulation/Gait Assistance 6: Modified independent (Device/Increase time)    Assistive device None    Gait Pattern Within Functional Limits    Ambulation Surface Level;Indoor    Gait velocity 9.28 sec's= 3.54 ft/sec      6 Minute Walk- Baseline   6 Minute Walk- Baseline yes    HR (bpm) 82    02 Sat (%RA) 100 %    Modified Borg Scale for Dyspnea 3- Moderate shortness of breath or breathing difficulty    Perceived Rate of Exertion (Borg) 7- Very, very light      6 Minute walk- Post Test   6 Minute Walk Post Test yes    HR (bpm) 80    02 Sat (%RA) 100 %    Modified Borg Scale for Dyspnea 4- somewhat severe    Perceived Rate of Exertion (Borg) 11- Fairly light      6 minute walk test results    Aerobic Endurance Distance Walked 1427    Endurance additional comments without AD, without rest break, some dizziness noted  Functional Gait  Assessment   Gait assessed  Yes    Gait Level Surface Walks 20 ft in less than 5.5 sec, no assistive devices, good speed, no evidence for imbalance, normal gait pattern, deviates no more than 6 in outside of the 12 in walkway width.   5.43   Change in Gait Speed Able to smoothly change walking speed without loss of balance or gait deviation. Deviate no more than 6 in outside of the 12 in walkway width.    Gait with Horizontal Head Turns Performs head turns smoothly with no change in gait. Deviates no more than 6 in outside 12 in walkway width    Gait with Vertical Head Turns Performs head turns with no change in gait. Deviates no more than 6 in outside 12 in walkway width.    Gait and Pivot Turn Pivot turns safely within 3 sec and stops quickly with no loss of balance.    Step Over Obstacle Is able to step over 2 stacked shoe boxes taped together (9 in total height) without changing gait speed. No evidence of imbalance.    Gait with  Narrow Base of Support Ambulates 7-9 steps.    Gait with Eyes Closed Walks 20 ft, slow speed, abnormal gait pattern, evidence for imbalance, deviates 10-15 in outside 12 in walkway width. Requires more than 9 sec to ambulate 20 ft.   11 sec's   Ambulating Backwards Walks 20 ft, uses assistive device, slower speed, mild gait deviations, deviates 6-10 in outside 12 in walkway width.    Steps Alternating feet, must use rail.    Total Score 25    FGA comment: 25-28 low fall risk               PT Short Term Goals - 03/09/20 1024      PT SHORT TERM GOAL #1   Title Pt will be IND with initial HEP in order to indicate improved function and decreased fall risk.  (Target Date: 03/09/20)    Baseline 03/09/20: met with current program, goes slowly due to fatigue.    Status Achieved    Target Date 03/09/20      PT SHORT TERM GOAL #2   Title Pt will ambulate at mod I level without AD with gait speed of >/=2.62 ft/sec in order to indicate safe community ambulator.    Baseline 03/09/20: 3.54 ft/sec no AD    Time --    Period --    Status Achieved      PT SHORT TERM GOAL #3   Title Pt will improve 5TSS time to </=13 secs without UE support in order to indicate dec fall risk and improved strength.    Baseline 03/09/20: 14.91 sec's with hands on thighs, improved from 16.56 sec's, just not to goal    Time --    Period --    Status Partially Met      PT SHORT TERM GOAL #4   Title Pt will perform 6MWT and improve distance by 150' in order to indicate improved functional endurance.    Baseline 03/09/20: pt improved her 6 minute walk test by 180 feet (was 1237 feet at baseline, 1427 feet today)    Time --    Period --    Status Achieved      PT SHORT TERM GOAL #5   Title Pt will improve FGA to >/=25/30 in order to indicate decreased fall risk.    Baseline 03/09/20: 25/30 scored today  Time --    Period --    Status Achieved             PT Long Term Goals - 02/08/20 1508      PT LONG TERM  GOAL #1   Title Pt will be IND with final HEP in order to indicate improved functional mobility and balance (Target Date: 04/08/20)    Baseline dependent    Time 8    Period Weeks    Status New    Target Date 04/08/20      PT LONG TERM GOAL #2   Title Pt will ambulate with gait speed >/=3.22 ft/sec in order to indicate more normal gait speed.    Baseline did not have time to assess    Time 8    Period Weeks    Status New      PT LONG TERM GOAL #3   Title Pt will improve 6MWT distance by 300' from baseline in order to indicate improved functional endurnace.    Baseline unable to assess on eval    Time 8    Period Weeks    Status New      PT LONG TERM GOAL #4   Title Pt will negotiate up/down 3 flights of stairs with single rail in alternating pattern with no more than 13/20 on RPE scale in order to indicate improved strength and endurance.    Baseline Pt is currently exhausted and needs several breaks when doing stairs.    Time 8    Period Weeks    Status New      PT LONG TERM GOAL #5   Title Pt will improve FGA to >/=28/30 in order to indicate decreased fall risk.    Baseline 22/30    Time 8    Period Weeks    Status New      Additional Long Term Goals   Additional Long Term Goals Yes      PT LONG TERM GOAL #6   Title Pt will tolerate light to moderate standing activity x 30 mins in order to indicate improved endurance and ability to transition back to work.    Baseline pt is not working at this time    Time [place] - 03/09/20 1024    Clinical Impression Statement Today's skilled session focused on progress toward STGs with goals partially to fully met. Pt demonstrated an 180 feet increase on her 6 minute walk test, increased score to 25/30 on the Functional Gait Assessment and improvement on both the 10 meter gait speed and 5 time sit to stand tests. The pt is making steady progress and should benefit from continued  PT to progress toward unmet goals.    Personal Factors and Comorbidities Age;Comorbidity 1;Finances;Time since onset of injury/illness/exacerbation;Profession    Comorbidities post covid    Examination-Activity Limitations Carry;Lift;Stand;Stairs;Squat;Locomotion Level;Transfers    Examination-Participation Restrictions Cleaning;Community Activity;Shop;Meal Prep    Stability/Clinical Decision Making Evolving/Moderate complexity    Rehab Potential Good    PT Frequency 2x / week    PT Duration 8 weeks    PT Treatment/Interventions ADLs/Self Care Home Management;Aquatic Therapy;DME Instruction;Gait training;Stair training;Functional mobility training;Therapeutic activities;Therapeutic exercise;Balance training;Neuromuscular re-education;Cognitive remediation;Patient/family education;Passive range of motion;Vestibular    PT Next Visit Plan Continue high level balance, endurance, high level gait/balance    Consulted and Agree with Plan  of Care Patient;Other (Comment)           Patient will benefit from skilled therapeutic intervention in order to improve the following deficits and impairments:  Cardiopulmonary status limiting activity, Decreased activity tolerance, Decreased balance, Decreased cognition, Decreased endurance, Decreased mobility, Decreased strength, Difficulty walking, Impaired perceived functional ability, Impaired flexibility, Impaired sensation, Postural dysfunction, Improper body mechanics  Visit Diagnosis: Muscle weakness (generalized)  Other abnormalities of gait and mobility  Unspecified disturbances of skin sensation     Problem List Patient Active Problem List   Diagnosis Date Noted  . Shortness of breath 02/04/2020  . Generalized anxiety disorder 02/04/2020  . Weakness of both lower extremities 02/04/2020  . Chronic nonintractable headache 02/04/2020  . Tremor of both hands 02/04/2020  . History of viral illness 02/04/2020    Willow Ora, PTA,  [facility] [address], [place], [place] [address] [phone] 03/10/20, 4:32 PM   Name: Shay Jhaveri MRN: [id number] Date of Birth: May 27, 1976

## 2020-03-16 ENCOUNTER — Encounter: Payer: Self-pay | Admitting: Rehabilitation

## 2020-03-16 ENCOUNTER — Other Ambulatory Visit: Payer: Self-pay

## 2020-03-16 ENCOUNTER — Ambulatory Visit: Payer: Self-pay | Attending: Nurse Practitioner | Admitting: Rehabilitation

## 2020-03-16 DIAGNOSIS — R2689 Other abnormalities of gait and mobility: Secondary | ICD-10-CM | POA: Insufficient documentation

## 2020-03-16 DIAGNOSIS — R209 Unspecified disturbances of skin sensation: Secondary | ICD-10-CM | POA: Insufficient documentation

## 2020-03-16 DIAGNOSIS — M6281 Muscle weakness (generalized): Secondary | ICD-10-CM | POA: Insufficient documentation

## 2020-03-16 DIAGNOSIS — R2681 Unsteadiness on feet: Secondary | ICD-10-CM | POA: Insufficient documentation

## 2020-03-16 NOTE — Therapy (Signed)
[facility] [address] [place], [place], [address] Phone: [phone]   Fax:  [phone]  Physical Therapy Treatment  Patient Details  Name: Sandra Savage MRN: [id number] Date of Birth: 09-24-1975 Referring Provider (PT): Lazaro Arms, NP   Encounter Date: 03/16/2020   PT End of Session - 03/16/20 0931    Visit Number 5    Number of Visits 17    Date for PT Re-Evaluation 04/08/20    Authorization Type Financial assistance application given (awaiting approval)    PT Start Time [phone]    PT Stop Time 0929    PT Time Calculation (min) 46 min    Equipment Utilized During Treatment Gait belt    Activity Tolerance Patient limited by fatigue;Patient tolerated treatment well    Behavior During Therapy [facility] for tasks assessed/performed           Past Medical History:  Diagnosis Date  . Angina at rest [facility])   . Anxiety   . Hypercholesteremia     Past Surgical History:  Procedure Laterality Date  . appendectomy     unknown date per patient   . MANDIBLE FRACTURE SURGERY     domestic     There were no vitals filed for this visit.   Subjective Assessment - 03/16/20 0845    Subjective No changes since last visit.    Patient is accompained by: Interpreter   via ipad   Pertinent History post covid    Limitations Walking;Standing;Lifting    How long can you stand comfortably? sometimes 10 mins, sometimes an hour, fluctuates    How long can you walk comfortably? about 10 mins    Patient Stated Goals "I want the tremors to go away, go back to work, to be able to recover"    Currently in Pain? No/denies                             [facility] Adult PT Treatment/Exercise - 03/16/20 0846      Transfers   Transfers Sit to Stand;Stand to Sit      Ambulation/Gait   Ambulation/Gait Yes    Ambulation/Gait Assistance 6: Modified independent (Device/Increase time)    Ambulation Distance (Feet) 500 Feet      Assistive device None    Gait Pattern Within Functional Limits    Ambulation Surface Level;Indoor      High Level Balance   High Level Balance Activities Backward walking;Direction changes;Sudden stops;Head turns    High Level Balance Comments performed the above tasks over a span of 500' with cues for improved arm swing, more controlled breathing and forward gaze.        Neuro Re-ed    Neuro Re-ed Details  In // bars for high level balance/strengthening:  Standing with single foot on BOSU (Blue top up) lifting opposite LE into march x 10 reps on each side, standing in middle of BOSU (blue top) moving opposite LE into abduction x 10 reps on each side.  Intermittent UE support as needed.  on BOSU (black top up): standing feet apart EO x 2 sets of 20 secs, mini squats x 10 reps with light UE support.        Exercises   Exercises Other Exercises    Other Exercises  side stepping pushing BOSU for abd strength x 10 reps each direction, side staggered jumps over small balance beam x 10 rep each direction.  Quick side  steps x 35' x 2 reps, jump lunges x 20 reps, quick step ups to 4" step x 20 reps, attempting jumping BLE to 4" step however this was too difficult therefore worked on jumping down x 10 reps (was still difficult to land both feet at same time).       Knee/Hip Exercises: Aerobic   Stepper Scifit stepper x 5 mins with BUE/LE at level 3 resistance maintaining rpms in 50-60's.  Tolerated well, fatigued but vitals WFL. (HR 95, SaO2 100%)                    PT Short Term Goals - 03/09/20 1024      PT SHORT TERM GOAL #1   Title Pt will be IND with initial HEP in order to indicate improved function and decreased fall risk.  (Target Date: 03/09/20)    Baseline 03/09/20: met with current program, goes slowly due to fatigue.    Status Achieved    Target Date 03/09/20      PT SHORT TERM GOAL #2   Title Pt will ambulate at mod I level without AD with gait speed of >/=2.62 ft/sec in  order to indicate safe community ambulator.    Baseline 03/09/20: 3.54 ft/sec no AD    Time --    Period --    Status Achieved      PT SHORT TERM GOAL #3   Title Pt will improve 5TSS time to </=13 secs without UE support in order to indicate dec fall risk and improved strength.    Baseline 03/09/20: 14.91 sec's with hands on thighs, improved from 16.56 sec's, just not to goal    Time --    Period --    Status Partially Met      PT SHORT TERM GOAL #4   Title Pt will perform 6MWT and improve distance by 150' in order to indicate improved functional endurance.    Baseline 03/09/20: pt improved her 6 minute walk test by 180 feet (was 1237 feet at baseline, 1427 feet today)    Time --    Period --    Status Achieved      PT SHORT TERM GOAL #5   Title Pt will improve FGA to >/=25/30 in order to indicate decreased fall risk.    Baseline 03/09/20: 25/30 scored today    Time --    Period --    Status Achieved             PT Long Term Goals - 02/08/20 1508      PT LONG TERM GOAL #1   Title Pt will be IND with final HEP in order to indicate improved functional mobility and balance (Target Date: 04/08/20)    Baseline dependent    Time 8    Period Weeks    Status New    Target Date 04/08/20      PT LONG TERM GOAL #2   Title Pt will ambulate with gait speed >/=3.22 ft/sec in order to indicate more normal gait speed.    Baseline did not have time to assess    Time 8    Period Weeks    Status New      PT LONG TERM GOAL #3   Title Pt will improve 6MWT distance by 300' from baseline in order to indicate improved functional endurnace.    Baseline unable to assess on eval    Time 8    Period Weeks    Status  New      PT LONG TERM GOAL #4   Title Pt will negotiate up/down 3 flights of stairs with single rail in alternating pattern with no more than 13/20 on RPE scale in order to indicate improved strength and endurance.    Baseline Pt is currently exhausted and needs several breaks  when doing stairs.    Time 8    Period Weeks    Status New      PT LONG TERM GOAL #5   Title Pt will improve FGA to >/=28/30 in order to indicate decreased fall risk.    Baseline 22/30    Time 8    Period Weeks    Status New      Additional Long Term Goals   Additional Long Term Goals Yes      PT LONG TERM GOAL #6   Title Pt will tolerate light to moderate standing activity x 30 mins in order to indicate improved endurance and ability to transition back to work.    Baseline pt is not working at this time    Time [place] - 03/16/20 0931    Clinical Impression Statement Skilled session focused on high level balance, strength and endurance with introduction of slight jumping movements.  Pt very fatigued but did well with frequent rest breaks.   Able to remain in standing for entire session.  Vitals remained WFL.    Personal Factors and Comorbidities Age;Comorbidity 1;Finances;Time since onset of injury/illness/exacerbation;Profession    Comorbidities post covid    Examination-Activity Limitations Carry;Lift;Stand;Stairs;Squat;Locomotion Level;Transfers    Examination-Participation Restrictions Cleaning;Community Activity;Shop;Meal Prep    Stability/Clinical Decision Making Evolving/Moderate complexity    Rehab Potential Good    PT Frequency 2x / week    PT Duration 8 weeks    PT Treatment/Interventions ADLs/Self Care Home Management;Aquatic Therapy;DME Instruction;Gait training;Stair training;Functional mobility training;Therapeutic activities;Therapeutic exercise;Balance training;Neuromuscular re-education;Cognitive remediation;Patient/family education;Passive range of motion;Vestibular    PT Next Visit Plan Continue high level balance, endurance, high level gait/balance    Consulted and Agree with Plan of Care Patient;Other (Comment)           Patient will benefit from skilled therapeutic intervention in order to improve the  following deficits and impairments:  Cardiopulmonary status limiting activity, Decreased activity tolerance, Decreased balance, Decreased cognition, Decreased endurance, Decreased mobility, Decreased strength, Difficulty walking, Impaired perceived functional ability, Impaired flexibility, Impaired sensation, Postural dysfunction, Improper body mechanics  Visit Diagnosis: Muscle weakness (generalized)  Other abnormalities of gait and mobility  Unspecified disturbances of skin sensation  Unsteadiness on feet     Problem List Patient Active Problem List   Diagnosis Date Noted  . Shortness of breath 02/04/2020  . Generalized anxiety disorder 02/04/2020  . Weakness of both lower extremities 02/04/2020  . Chronic nonintractable headache 02/04/2020  . Tremor of both hands 02/04/2020  . History of viral illness 02/04/2020   Cameron Sprang, PT, MPT [facility] [address] [place], [place], [address] Phone: [phone]   Fax:  [phone] 03/16/20, 9:34 AM  Name: Sandra Savage MRN: [id number] Date of Birth: Jan 13, 1976

## 2020-03-17 ENCOUNTER — Ambulatory Visit (INDEPENDENT_AMBULATORY_CARE_PROVIDER_SITE_OTHER): Payer: Self-pay

## 2020-03-17 ENCOUNTER — Encounter: Payer: Self-pay | Admitting: Pulmonary Disease

## 2020-03-17 ENCOUNTER — Ambulatory Visit (INDEPENDENT_AMBULATORY_CARE_PROVIDER_SITE_OTHER): Payer: Self-pay | Admitting: Pulmonary Disease

## 2020-03-17 VITALS — BP 118/72 | HR 70 | Temp 97.9°F | Ht 64.0 in | Wt 186.2 lb

## 2020-03-17 DIAGNOSIS — K219 Gastro-esophageal reflux disease without esophagitis: Secondary | ICD-10-CM

## 2020-03-17 DIAGNOSIS — R0602 Shortness of breath: Secondary | ICD-10-CM

## 2020-03-17 DIAGNOSIS — F419 Anxiety disorder, unspecified: Secondary | ICD-10-CM

## 2020-03-17 DIAGNOSIS — Z8616 Personal history of COVID-19: Secondary | ICD-10-CM | POA: Insufficient documentation

## 2020-03-17 DIAGNOSIS — R5381 Other malaise: Secondary | ICD-10-CM | POA: Insufficient documentation

## 2020-03-17 NOTE — Assessment & Plan Note (Signed)
Suspect patient has uncontrolled acid reflux Agree with previous recommendations from post Covid care unit team to start PPI Patient refuses to start PPI because she feels that it may worsen her liver functioning  Plan: Present back to post Covid care unit to discuss treatment therapies and lieu of primary care provider

## 2020-03-17 NOTE — Assessment & Plan Note (Signed)
Plan: Continue to work with physical therapy 

## 2020-03-17 NOTE — Patient Instructions (Addendum)
You were seen today by Coral Ceo, NP  for:   1. History of 2019 novel coronavirus disease (COVID-19)  - DG Chest 2 View; Future - Ambulatory referral to Cardiology  Continue to work with physical therapy  Chest x-ray today  2. Shortness of breath  - Ambulatory referral to Cardiology  Walk today in office  We will refer you to cardiology for further evaluation of your dyspnea on exertion  3. Gastroesophageal reflux disease, unspecified whether esophagitis present  Sounds like you may have uncontrolled acid reflux  You stop taking your omeprazole out of concerns of her liver pain from 2 months ago  Please discuss this with Angus Seller when you schedule a follow-up  4. Physical deconditioning  Continue physical therapy  5. Anxiety   Establish with psychiatry  Continue hydroxyzine  Keep follow-up with Angus Seller, NP  We recommend today:  Orders Placed This Encounter  Procedures  . DG Chest 2 View    Standing Status:   Future    Standing Expiration Date:   06/17/2020    Order Specific Question:   Reason for Exam (SYMPTOM  OR DIAGNOSIS REQUIRED)    Answer:   doe, hx of covid infection    Order Specific Question:   Preferred imaging location?    Answer:   Internal    Order Specific Question:   Radiology Contrast Protocol - do NOT remove file path    Answer:   [email or web address]  . Ambulatory referral to Cardiology    Referral Priority:   Routine    Referral Type:   Consultation    Referral Reason:   Specialty Services Required    Requested Specialty:   Cardiology    Number of Visits Requested:   1   Orders Placed This Encounter  Procedures  . DG Chest 2 View  . Ambulatory referral to Cardiology   No orders of the defined types were placed in this encounter.   Follow Up:    Return in about 3 months (around 06/17/2020), or if symptoms worsen or fail to improve, for Follow up with Dr. Tonia Brooms.   Please do your  part to reduce the spread of COVID-19:      Reduce your risk of any infection  and COVID19 by using the similar precautions used for avoiding the common cold or flu:  Marland Kitchen Wash your hands often with soap and warm water for at least 20 seconds.  If soap and water are not readily available, use an alcohol-based hand sanitizer with at least 60% alcohol.  . If coughing or sneezing, cover your mouth and nose by coughing or sneezing into the elbow areas of your shirt or coat, into a tissue or into your sleeve (not your hands). Drinda Butts A MASK when in public  . Avoid shaking hands with others and consider head nods or verbal greetings only. . Avoid touching your eyes, nose, or mouth with unwashed hands.  . Avoid close contact with people who are sick. . Avoid places or events with large numbers of people in one location, like concerts or sporting events. . If you have some symptoms but not all symptoms, continue to monitor at home and seek medical attention if your symptoms worsen. . If you are having a medical emergency, call 911.   ADDITIONAL HEALTHCARE OPTIONS FOR PATIENTS  [facility] / e-Visit: [email or web address]         [facility]: [phone]  [facility]: [phone]  [facility]: [phone]     It is flu season:   >>> Best ways to protect herself from the flu: Receive the yearly flu vaccine, practice good hand hygiene washing with soap and also using hand sanitizer when available, eat a nutritious meals, get adequate rest, hydrate appropriately   Please contact the office if your symptoms worsen or you have concerns that you are not improving.   Thank you for choosing [facility] for your healthcare, and for allowing [place] to partner with you on your healthcare journey. I am thankful to be able to provide care to you today.   Wyn Quaker FNP-C

## 2020-03-17 NOTE — Assessment & Plan Note (Signed)
Believe this is a component of the patient's shortness of breath problems Agree with establishing forward with psychiatry Continue hydroxyzine  Plan: Keep follow-up with Angus Seller, NP and post Covid care unit until you can establish with psychiatry Continue hydroxyzine

## 2020-03-17 NOTE — Assessment & Plan Note (Signed)
Previous documentation shows that patient felt that she contracted Covid in June/2020 Repeat testing in July/2020 did not show a positive Covid test or positive antibodies Testing in November/2020 did not show positive Covid antibodies Recent Covid antibody test and post Covid care unit in July/2021 did show positive antibodies  Discussion: Reviewed with patient this likely means the patient has had a more recent Covid infection.  Patient admits no worsened viral symptoms over the last 2 months.  She continues to have ongoing shortness of breath and cough.  Patient is not vaccinated.  Plan: Chest x-ray today Walk today in office Keep follow-up with post Covid care unit Based off of chest x-ray could consider repeat imaging or repeat lung functioning

## 2020-03-17 NOTE — Assessment & Plan Note (Signed)
High-resolution CT chest Stable pulmonary function Recent positive Covid antibody test Walk today in office, stable no oxygen desaturations  Plan: Chest x-ray today Referred to cardiology for evaluation of dyspnea on exertion

## 2020-03-17 NOTE — Progress Notes (Signed)
@Patient  ID: Sandra Savage, female    DOB: [birth year], 44 y.o.   MRN: [id number]  Chief Complaint  Patient presents with  . Follow-up    pt is sob when doing anything.pt uses rescue inhaler twice daily    Referring provider: Garner Nash, DO  HPI:  44 year old never smoker followed in our office for DOE and suspected COVID-19 infection  PMH: Anxiety, GERD Smoker/ Smoking History: Never Smoker  Maintenance: Symbicort 80 Pt of:  Dr. Valeta Harms   03/17/2020  - Visit   44 year old female never smoker initially referred to our office in November/2020.  She establish care with Dr. Valeta Harms at that time.  She is reporting worsening shortness of breath status post COVID-19.  She feels that she had Covid but she was never actually tested.  She was tested 1 month later in the emergency room and found to be negative.  She felt that she had Covid in June/2020.  She is having worsened fatigue and weakness.  Prior to presenting to our office in [place] she was working as a Restaurant manager, fast food of care from the Agency office visit with Dr. Valeta Harms was for the patient to obtain pulmonary function testing as well as a high-resolution CT chest.  She was encouraged to follow back up with primary care for management of her anxiety.  At that time point in time patient was taking CBD Gummies for management of anxiety.  Since last being seen patient has been seen by Lazaro Arms, NP in the post Covid care unit.  Antibody testing was done there on 03/07/2020 that showed that she was SARS-CoV-2 positive.  Patient was encouraged to start on omeprazole.  As well as referred to psychiatry and neurology.  Patient reports that she is been working with physical therapy.  She has not been seen by cardiology before.  She has been using hydroxyzine but she is unsure if this is helpful with her anxiety management.  She does not have an upcoming follow-up with the post Covid care unit.  She is unable to tell me who her  primary care provider is.  Patient walked in office today with no oxygen desaturations on room air.  Questionaires / Pulmonary Flowsheets:   ACT:  No flowsheet data found.  MMRC: No flowsheet data found.  Epworth:  No flowsheet data found.  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  09/12/2015-CT maxillofacial-paranasal sinuses are clear  08/12/2019-pulmonary function test-FVC 3.77 (96% predicted), postbronchodilator ratio 86, postbronchodilator FEV1 3.25 (103% predicted), no positive bronchodilator response, mid flow reversibility, DLCO 29.27 (127% predicted) Interpretation: Normal spirometry no obstruction or significant bronchodilator response, elevated DLCO which can be seen in asthma, obesity as well as high cardiac output states  03/07/20 - sars cov2 antibody positive    FENO:  No results found for: NITRICOXIDE  PFT: PFT Results Latest Ref Rng & Units 07/16/2019  FVC-Pre L 3.77  FVC-Predicted Pre % 96  FVC-Post L 3.80  FVC-Predicted Post % 97  Pre FEV1/FVC % % 82  Post FEV1/FCV % % 86  FEV1-Pre L 3.07  FEV1-Predicted Pre % 98  FEV1-Post L 3.25  DLCO uncorrected ml/min/mmHg 29.27  DLCO UNC% % 127  DLVA Predicted % 135    WALK:  SIX MIN WALK 03/17/2020  Tech Comments: pt walked at steady pace no  desaturtion in oxygen on all three laps    Imaging: DG Chest 2 View  Result Date: 03/17/2020 CLINICAL DATA:  Dyspnea EXAM: CHEST - 2 VIEW COMPARISON:  None. FINDINGS: The cardiomediastinal silhouette is unchanged in contour. No pleural effusion. No pneumothorax. No acute pleuroparenchymal abnormality. Visualized abdomen is unremarkable. Mild degenerative changes of the thoracic spine. IMPRESSION: No acute cardiopulmonary abnormality.  Electronically Signed   By: Valentino Saxon MD   On: 03/17/2020 12:05    Lab Results:  CBC    Component Value Date/Time   WBC 7.3 03/07/2020 1145   RBC 5.18 03/07/2020 1145   HGB 14.3 03/07/2020 1145   HCT 42.7 03/07/2020 1145   PLT 292 03/07/2020 1145   MCV 82 03/07/2020 1145   MCH 27.6 03/07/2020 1145   MCHC 33.5 03/07/2020 1145   RDW 12.7 03/07/2020 1145    BMET    Component Value Date/Time   NA 140 03/07/2020 1145   K 4.2 03/07/2020 1145   CL 105 03/07/2020 1145   CO2 18 (L) 03/07/2020 1145   GLUCOSE 90 03/07/2020 1145   BUN 10 03/07/2020 1145   CREATININE 0.76 03/07/2020 1145   CALCIUM 9.7 03/07/2020 1145   GFRNONAA 95 03/07/2020 1145   GFRAA 110 03/07/2020 1145    BNP No results found for: BNP  ProBNP No results found for: PROBNP  Specialty Problems      Pulmonary Problems   Shortness of breath      No Known Allergies   There is no immunization history on file for this patient.  Past Medical History:  Diagnosis Date  . Angina at rest [facility])   . Anxiety   . Hypercholesteremia     Tobacco History: Social History   Tobacco Use  Smoking Status Never Smoker  Smokeless Tobacco Never Used   Counseling given: Yes   Continue to not smoke  Outpatient Encounter Medications as of 03/17/2020  Medication Sig  . budesonide-formoterol (SYMBICORT) 80-4.5 MCG/ACT inhaler Inhale 2 puffs into the lungs 2 (two) times daily.  . Cyanocobalamin (B-12 COMPLIANCE INJECTION) 1000 MCG/ML KIT Inject as directed once a week.  . gabapentin (NEURONTIN) 100 MG capsule Take 200 mg by mouth daily.  . hydrOXYzine (ATARAX/VISTARIL) 25 MG tablet Take 1 tablet (25 mg total) by mouth at bedtime as needed.  . NON FORMULARY Take 500 mg by mouth. 523m CBD Gummie   . omeprazole (PRILOSEC) 20 MG capsule Take 1 capsule (20 mg total) by mouth daily.  . DULoxetine (CYMBALTA) 30 MG capsule Take 1 capsule (30 mg total) by mouth daily. (Patient not taking: Reported on 02/08/2020)    No facility-administered encounter medications on file as of 03/17/2020.     Review of Systems  Review of Systems  Constitutional: Positive for fatigue. Negative for activity change and fever.  HENT: Negative for congestion, sinus pressure, sinus pain and sore throat.   Respiratory: Positive for cough and shortness of breath. Negative for wheezing.   Cardiovascular: Positive for palpitations. Negative for chest pain.  Gastrointestinal: Negative for diarrhea, nausea and vomiting.  Musculoskeletal: Negative for arthralgias.  Neurological: Negative for dizziness.  Psychiatric/Behavioral: Negative for sleep disturbance. The patient is nervous/anxious.      Physical Exam  BP 118/72 (BP Location: Left Arm, Cuff Size: Normal)   Pulse 70   Temp 97.9 F (36.6 C) (Oral)   Ht 5' 4"  (1.626 m)   Wt 186 lb 3.2 oz (84.5 kg)   SpO2 92%   BMI 31.96 kg/m  Wt Readings from Last 5 Encounters:  03/17/20 186 lb 3.2 oz (84.5 kg)  03/04/20 186 lb 0.1 oz (84.4 kg)  07/02/19 181 lb 6.4 oz (82.3 kg)    BMI Readings from Last 5 Encounters:  03/17/20 31.96 kg/m  03/04/20 30.95 kg/m  07/02/19 30.19 kg/m     Physical Exam Vitals and nursing note reviewed.  Constitutional:      General: She is not in acute distress.    Appearance: Normal appearance. She is obese.  HENT:     Head: Normocephalic and atraumatic.     Right Ear: Tympanic membrane, ear canal and external ear normal. There is no impacted cerumen.     Left Ear: Tympanic membrane, ear canal and external ear normal. There is no impacted cerumen.     Nose: Rhinorrhea present. No congestion.     Mouth/Throat:     Mouth: Mucous membranes are moist.     Pharynx: Oropharynx is clear.  Eyes:     Pupils: Pupils are equal, round, and reactive to light.  Cardiovascular:     Rate and Rhythm: Normal rate and regular rhythm.     Pulses: Normal pulses.     Heart sounds: Normal heart sounds. No murmur heard.   Pulmonary:     Breath  sounds: Normal breath sounds. No decreased air movement. No decreased breath sounds, wheezing or rales.  Musculoskeletal:     Cervical back: Normal range of motion.  Skin:    General: Skin is warm and dry.     Capillary Refill: Capillary refill takes less than 2 seconds.  Neurological:     General: No focal deficit present.     Mental Status: She is alert and oriented to person, place, and time. Mental status is at baseline.     Gait: Gait normal.  Psychiatric:        Mood and Affect: Mood normal.        Behavior: Behavior normal.        Thought Content: Thought content normal.        Judgment: Judgment normal.       Assessment & Plan:   GERD (gastroesophageal reflux disease) Suspect patient has uncontrolled acid reflux Agree with previous recommendations from post Covid care unit team to start PPI Patient refuses to start PPI because she feels that it may worsen her liver functioning  Plan: Present back to post Covid care unit to discuss treatment therapies and lieu of primary care provider  Shortness of breath High-resolution CT chest Stable pulmonary function Recent positive Covid antibody test Walk today in office, stable no oxygen desaturations  Plan: Chest x-ray today Referred to cardiology for evaluation of dyspnea on exertion  Physical deconditioning Plan: Continue to work with physical therapy  Anxiety Believe this is a component of the patient's shortness of breath problems Agree with establishing forward with psychiatry Continue hydroxyzine  Plan: Keep follow-up with Lazaro Arms, NP and post Covid care unit until you can establish with psychiatry Continue hydroxyzine  History of 2019 novel coronavirus disease (COVID-19) Previous documentation shows that patient felt that she contracted Covid in June/2020 Repeat testing in July/2020 did not show a positive Covid test or positive antibodies Testing in November/2020 did not show positive Covid  antibodies Recent Covid antibody test and post Covid care unit in July/2021 did show positive antibodies  Discussion: Reviewed with patient this likely means the patient has had a more recent Covid infection.  Patient admits no worsened viral symptoms over the  last 2 months.  She continues to have ongoing shortness of breath and cough.  Patient is not vaccinated.  Plan: Chest x-ray today Walk today in office Keep follow-up with post Covid care unit Based off of chest x-ray could consider repeat imaging or repeat lung functioning    Return in about 3 months (around 06/17/2020), or if symptoms worsen or fail to improve, for Follow up with Dr. Valeta Harms.   Lauraine Rinne, NP 03/17/2020   This appointment required 34 minutes of patient care (this includes precharting, chart review, review of results, face-to-face care, etc.).

## 2020-03-22 ENCOUNTER — Ambulatory Visit: Payer: Self-pay | Admitting: Family Medicine

## 2020-03-23 ENCOUNTER — Ambulatory Visit: Payer: Self-pay | Admitting: Physical Therapy

## 2020-03-23 ENCOUNTER — Encounter: Payer: Self-pay | Admitting: Physical Therapy

## 2020-03-23 ENCOUNTER — Other Ambulatory Visit: Payer: Self-pay

## 2020-03-23 DIAGNOSIS — R2689 Other abnormalities of gait and mobility: Secondary | ICD-10-CM

## 2020-03-23 DIAGNOSIS — R2681 Unsteadiness on feet: Secondary | ICD-10-CM

## 2020-03-23 DIAGNOSIS — R209 Unspecified disturbances of skin sensation: Secondary | ICD-10-CM

## 2020-03-23 DIAGNOSIS — M6281 Muscle weakness (generalized): Secondary | ICD-10-CM
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
## 2020-03-30 ENCOUNTER — Other Ambulatory Visit: Payer: Self-pay

## 2020-03-30 ENCOUNTER — Ambulatory Visit: Payer: Self-pay | Admitting: Rehabilitation

## 2020-03-30 ENCOUNTER — Encounter: Payer: Self-pay | Admitting: Rehabilitation

## 2020-03-30 DIAGNOSIS — R2689 Other abnormalities of gait and mobility: Secondary | ICD-10-CM

## 2020-03-30 DIAGNOSIS — R209 Unspecified disturbances of skin sensation: Secondary | ICD-10-CM

## 2020-03-30 DIAGNOSIS — R2681 Unsteadiness on feet: Secondary | ICD-10-CM

## 2020-03-30 DIAGNOSIS — M6281 Muscle weakness (generalized): Secondary | ICD-10-CM
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
## 2020-04-04 ENCOUNTER — Encounter: Payer: Self-pay | Admitting: Neurology

## 2020-04-04 ENCOUNTER — Other Ambulatory Visit: Payer: Self-pay

## 2020-04-04 ENCOUNTER — Encounter: Payer: Self-pay | Admitting: Cardiology

## 2020-04-04 ENCOUNTER — Telehealth: Payer: Self-pay | Admitting: Neurology

## 2020-04-04 ENCOUNTER — Ambulatory Visit (INDEPENDENT_AMBULATORY_CARE_PROVIDER_SITE_OTHER): Payer: Self-pay | Admitting: Neurology

## 2020-04-04 VITALS — BP 132/84 | HR 87 | Ht 64.0 in | Wt 185.0 lb

## 2020-04-04 DIAGNOSIS — R251 Tremor, unspecified: Secondary | ICD-10-CM

## 2020-04-04 DIAGNOSIS — Z7189 Other specified counseling: Secondary | ICD-10-CM | POA: Insufficient documentation

## 2020-04-04 DIAGNOSIS — R52 Pain, unspecified: Secondary | ICD-10-CM

## 2020-04-04 DIAGNOSIS — R531 Weakness: Secondary | ICD-10-CM
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
## 2020-04-04 NOTE — Progress Notes (Signed)
Cardiology Office Note   Date:  04/05/2020   ID:  Tahjanae Savage, DOB 11-01-1975, MRN [id number]  PCP:  Fenton Foy, NP  Cardiologist:   No primary care provider on file. Referring:  Fenton Foy, NP  Chief Complaint  Patient presents with  . Shortness of Breath      History of Present Illness: Sandra Savage is a 44 y.o. female who is referred by Fenton Foy, NP for evaluation of SOB.     She had COVID 19 last year.  She said that since then she has been short of breath.  She has been weak and jittery.  I did review pulmonary testing that she had done.  She had a CT which showed no evidence of fibrotic interstitial lung disease.  There was some consolidation mild in the medial right middle lobe consistent with prior infection.  It was thought this could have been an atypical Mycobacterium.  She had pulmonary function testing appeared to be unremarkable.  She is being treated with bronchodilators as below but says she has not really had much improvement.  Since she gets sick she has been weak and unable to work.  She reports shortness of breath doing moderate activity such as walking across level ground.  She is not describing PND or orthopnea.  She is not describing palpitations, presyncope or syncope.  She is not having edema.  She says she has gained about 20 pounds since her infection.  She has never had any significant prior cardiac history.  She has not had any significant prior cardiac work-up.  Past Medical History:  Diagnosis Date  . Anxiety   . Fatty liver   . Hypercholesteremia     Past Surgical History:  Procedure Laterality Date  . APPENDECTOMY    . MANDIBLE FRACTURE SURGERY     domestic      Current Outpatient Medications  Medication Sig Dispense Refill  . budesonide-formoterol (SYMBICORT) 80-4.5 MCG/ACT inhaler Inhale 2 puffs into the lungs 2 (two) times daily. 1 Inhaler 3  . Cyanocobalamin (B-12 COMPLIANCE INJECTION) 1000 MCG/ML KIT  Inject as directed once a week.    . DULoxetine (CYMBALTA) 30 MG capsule Take 1 capsule (30 mg total) by mouth daily. 30 capsule 3  . gabapentin (NEURONTIN) 100 MG capsule Take 300 mg by mouth daily.     . hydrOXYzine (ATARAX/VISTARIL) 25 MG tablet Take 1 tablet (25 mg total) by mouth at bedtime as needed. 30 tablet 0  . omeprazole (PRILOSEC) 20 MG capsule Take 1 capsule (20 mg total) by mouth daily. 30 capsule 3   No current facility-administered medications for this visit.    Allergies:   Patient has no known allergies.    Social History:  The patient  reports that she has never smoked. She has never used smokeless tobacco. She reports that she does not drink alcohol and does not use drugs.   Family History:  The patient's family history includes High blood pressure in her mother.    ROS:  Please see the history of present illness.   Otherwise, review of systems are positive for none.   All other systems are reviewed and negative.    PHYSICAL EXAM: VS:  BP 104/74   Pulse 73   Ht _0  (1.626 m)   Wt 185 lb 9.6 oz (84.2 kg)   LMP 03/30/2020   SpO2 98%   BMI 31.86 kg/m  , BMI Body mass index is 31.86 kg/m. GENERAL:  Well appearing HEENT:  Pupils equal round and reactive, fundi not visualized, oral mucosa unremarkable NECK:  No jugular venous distention, waveform within normal limits, carotid upstroke brisk and symmetric, no bruits, no thyromegaly LYMPHATICS:  No cervical, inguinal adenopathy LUNGS:  Clear to auscultation bilaterally BACK:  No CVA tenderness CHEST:  Unremarkable HEART:  PMI not displaced or sustained,S1 and S2 within normal limits, no S3, no S4, no clicks, no rubs, no murmurs ABD:  Flat, positive bowel sounds normal in frequency in pitch, no bruits, no rebound, no guarding, no midline pulsatile mass, no hepatomegaly, no splenomegaly EXT:  2 plus pulses throughout, no edema, no cyanosis no clubbing SKIN:  No rashes no nodules NEURO:  Cranial nerves II through  XII grossly intact, motor grossly intact throughout PSYCH:  Cognitively intact, oriented to person place and time    EKG:  EKG is ordered today. The ekg ordered today demonstrates sinus rhythm, rate 73, axis within normal limits, intervals within normal limits, borderline low voltage in the chest leads.   Recent Labs: 03/07/2020: ALT 13; BUN 10; Creatinine, Ser 0.76; Hemoglobin 14.3; Platelets 292; Potassium 4.2; Sodium 140; TSH 3.340    Lipid Panel No results found for: CHOL, TRIG, HDL, CHOLHDL, VLDL, LDLCALC, LDLDIRECT    Wt Readings from Last 3 Encounters:  04/05/20 185 lb 9.6 oz (84.2 kg)  04/04/20 185 lb (83.9 kg)  03/17/20 186 lb 3.2 oz (84.5 kg)      Other studies Reviewed: Additional studies/ records that were reviewed today include: PFTs, CT. Review of the above records demonstrates:  Please see elsewhere in the note.     ASSESSMENT AND PLAN:  SOB:    Her shortness of breath is unlikely cardiac but I will check a BNP level.  I do see that she has had ESR and CRP yesterday that were normal.  CK was normal.  I will check an echocardiogram.  However, if the blood work and the echo comes back normal I would not suggest further cardiac work-up.  COVID EDUCATION: We did discuss getting vaccinated as she has not had this.  Current medicines are reviewed at length with the patient today.  The patient does not have concerns regarding medicines.  The following changes have been made:  no change  Labs/ tests ordered today include:   Orders Placed This Encounter  Procedures  . Brain natriuretic peptide  . EKG 12-Lead  . ECHOCARDIOGRAM COMPLETE     Disposition:   FU with me as needed and as based on the results of the above testing.   Signed, Minus Breeding, MD  04/05/2020 10:55 AM    [place]

## 2020-04-04 NOTE — Patient Instructions (Signed)
I understand, that you have several concerns.  Your neurological exam is good today, very reassuring.  You do not have a significant tremor or significant weakness or numbness.  Reflexes look good and your coordination and balance are also good.  Nevertheless, since you have had some stabbing head pains and tremors, I would like to recommend a brain MRI with and without contrast to rule out a structural cause of your symptoms.  In addition, since you have had weakness affecting both hands and both feet, I would like to add additional blood work to look for inflammatory markers.  As discussed, I would also like to proceed with electrical nerve and muscle testing through our office to look for signs of nerve damage or abnormalities with the electrical activity of your muscles.  I am hopeful that all test results will be benign and help reassure you.  We will call you with the results and have you follow-up to see one of our nurse practitioners in about 3 months to discuss all the results together and to make sure that your exam still looks good. As you have had some blurry vision, I think it is important that you see an eye doctor for formal eye examination.  On my examination your eyes looked good today.

## 2020-04-04 NOTE — Telephone Encounter (Signed)
cone assistanc exp. 08/09/20 order sent to GI. They will reach out to the patient to schedule.

## 2020-04-05 ENCOUNTER — Other Ambulatory Visit: Payer: Self-pay

## 2020-04-05 ENCOUNTER — Encounter: Payer: Self-pay | Admitting: Cardiology

## 2020-04-05 ENCOUNTER — Ambulatory Visit (INDEPENDENT_AMBULATORY_CARE_PROVIDER_SITE_OTHER): Payer: Self-pay | Admitting: Cardiology

## 2020-04-05 VITALS — BP 104/74 | HR 73 | Ht 64.0 in | Wt 185.6 lb

## 2020-04-05 DIAGNOSIS — R0602 Shortness of breath: Secondary | ICD-10-CM

## 2020-04-05 DIAGNOSIS — Z7189 Other specified counseling: Secondary | ICD-10-CM

## 2020-04-05 LAB — CK: Total CK: 61 U/L (ref 32–182)

## 2020-04-05 LAB — SEDIMENTATION RATE: Sed Rate: 27 mm/hr (ref 0–32)

## 2020-04-05 LAB — ANA W/REFLEX: Anti Nuclear Antibody (ANA): NEGATIVE

## 2020-04-05 LAB — C-REACTIVE PROTEIN: CRP: 4 mg/L (ref 0–10)

## 2020-04-05 LAB — HGB A1C W/O EAG: Hgb A1c MFr Bld: 5 % (ref 4.8–5.6)

## 2020-04-05 NOTE — Patient Instructions (Signed)
Medication Instructions:  No changes *If you need a refill on your cardiac medications before your next appointment, please call your pharmacy*  Lab Work: Your physician recommends that you return for lab work: Today (BNP) If you have labs (blood work) drawn today and your tests are completely normal, you will receive your results only by: Marland Kitchen MyChart Message (if you have MyChart) OR . A paper copy in the mail If you have any lab test that is abnormal or we need to change your treatment, we will call you to review the results.  Testing/Procedures: Your physician has requested that you have an echocardiogram. Echocardiography is a painless test that uses sound waves to create images of your heart. It provides your doctor with information about the size and shape of your heart and how well your heart's chambers and valves are working. This procedure takes approximately one hour. There are no restrictions for this procedure. [address]  Follow-Up: At [facility], you and your health needs are our priority.  As part of our continuing mission to provide you with exceptional heart care, we have created designated Provider Care Teams.  These Care Teams include your primary Cardiologist (physician) and Advanced Practice Providers (APPs -  Physician Assistants and Nurse Practitioners) who all work together to provide you with the care you need, when you need it.  We recommend signing up for the patient portal called "MyChart".  Sign up information is provided on this After Visit Summary.  MyChart is used to connect with patients for Virtual Visits (Telemedicine).  Patients are able to view lab/test results, encounter notes, upcoming appointments, etc.  Non-urgent messages can be sent to your provider as well.   To learn more about what you can do with MyChart, go to [email or web address].    Your next appointment:   Follow up as needed

## 2020-04-05 NOTE — Progress Notes (Signed)
Please call patient with Spanish interpreter and advise her that her labs were normal, we checked muscle enzyme, inflammatory and autoimmune markers, labs were all fine which is reassuring.

## 2020-04-06 ENCOUNTER — Ambulatory Visit: Payer: Self-pay | Admitting: Rehabilitation

## 2020-04-06 ENCOUNTER — Encounter: Payer: Self-pay | Admitting: Rehabilitation

## 2020-04-06 ENCOUNTER — Telehealth: Payer: Self-pay

## 2020-04-06 DIAGNOSIS — R209 Unspecified disturbances of skin sensation: Secondary | ICD-10-CM

## 2020-04-06 DIAGNOSIS — R2681 Unsteadiness on feet: Secondary | ICD-10-CM

## 2020-04-06 DIAGNOSIS — R2689 Other abnormalities of gait and mobility: Secondary | ICD-10-CM

## 2020-04-06 DIAGNOSIS — M6281 Muscle weakness (generalized): Secondary | ICD-10-CM

## 2020-04-06 LAB — BRAIN NATRIURETIC PEPTIDE: BNP: 18.6 pg/mL (ref 0.0–100.0)

## 2020-04-06 NOTE — Telephone Encounter (Signed)
-----   Message from Huston Foley, MD sent at 04/05/2020 12:12 PM EDT ----- Please call patient with Spanish interpreter and advise her that her labs were normal, we checked muscle enzyme, inflammatory and autoimmune markers, labs were all fine which is reassuring.

## 2020-04-06 NOTE — Telephone Encounter (Signed)
I called the pt via spanish interpreter phone line. Pt was not available and vm was left asking for her to call our office back.

## 2020-04-06 NOTE — Patient Instructions (Signed)
Access Code: [email or web address] URL: [email or web address] Date: 04/06/2020 Prepared by: Harriet Butte  Exercises Seated Shoulder Rolls - 1 x daily - 5-7 x weekly - 2 sets - 10 reps Tandem Walking with Counter Support - 1 x daily - 7 x weekly - 1 sets - 4 reps Sit to Stand without Arm Support - 1 x daily - 7 x weekly - 3 sets - 10 reps Romberg Stance Eyes Closed on Foam Pad - 1 x daily - 7 x weekly - 3 sets - 10 reps Single Leg Stance on Foam Pad - 1 x daily - 7 x weekly - 3 sets - 10 reps Tandem Stance on Foam Pad with Eyes Open - 1 x daily - 7 x weekly - 3 sets - 10 reps Walking Forward Lunge - 1 x daily - 7 x weekly - 1 sets - 10 reps

## 2020-04-06 NOTE — Therapy (Signed)
[facility] [address] [place], [place], [address] Phone: [phone]   Fax:  [phone]  Physical Therapy Treatment  Patient Details  Name: Sandra Savage MRN: [id number] Date of Birth: 09-16-1975 Referring Provider (PT): Lazaro Arms, NP   Encounter Date: 04/06/2020   PT End of Session - 04/06/20 1345    Visit Number 8    Number of Visits 17    Date for PT Re-Evaluation 04/08/20    Authorization Type Financial assistance application given (awaiting approval)    PT Start Time 1019    PT Stop Time 1104    PT Time Calculation (min) 45 min    Equipment Utilized During Treatment Gait belt    Activity Tolerance Patient limited by fatigue;Patient tolerated treatment well    Behavior During Therapy [facility] for tasks assessed/performed           Past Medical History:  Diagnosis Date  . Anxiety   . Fatty liver   . Hypercholesteremia     Past Surgical History:  Procedure Laterality Date  . APPENDECTOMY    . MANDIBLE FRACTURE SURGERY     domestic     There were no vitals filed for this visit.   Subjective Assessment - 04/06/20 1025    Subjective No complaints, had a couple of MD visits with blood work, hasn't been told results yet.    Pertinent History post covid    Limitations Walking;Standing;Lifting    How long can you stand comfortably? sometimes 10 mins, sometimes an hour, fluctuates    Patient Stated Goals "I want the tremors to go away, go back to work, to be able to recover"    Currently in Pain? No/denies              [facility] PT Assessment - 04/06/20 1027      Ambulation/Gait   Ambulation/Gait Yes    Ambulation/Gait Assistance 6: Modified independent (Device/Increase time)    Ambulation Distance (Feet) 1284 Feet   plus 300'   Assistive device None    Gait Pattern Within Functional Limits    Ambulation Surface Level;Indoor    Gait velocity 3.9 ft/sec without device     Stairs Yes    Stairs  Assistance 6: Modified independent (Device/Increase time)    Stair Management Technique No rails;Alternating pattern;Forwards      6 Minute Walk- Baseline   6 Minute Walk- Baseline yes    BP (mmHg) 106/67    HR (bpm) 76    02 Sat (%RA) 99 %    Modified Borg Scale for Dyspnea 2- Mild shortness of breath    Perceived Rate of Exertion (Borg) 7- Very, very light      6 Minute walk- Post Test   6 Minute Walk Post Test yes    BP (mmHg) 103/77    HR (bpm) 78    02 Sat (%RA) 98 %    Modified Borg Scale for Dyspnea 3- Moderate shortness of breath or breathing difficulty    Perceived Rate of Exertion (Borg) 13- Somewhat hard      6 minute walk test results    Aerobic Endurance Distance Walked 1284    Endurance additional comments No device and no rest breaks       Functional Gait  Assessment   Gait assessed  Yes    Gait Level Surface Walks 20 ft in less than 5.5 sec, no assistive devices, good speed, no evidence for imbalance, normal gait pattern, deviates no more  than 6 in outside of the 12 in walkway width.    Change in Gait Speed Able to smoothly change walking speed without loss of balance or gait deviation. Deviate no more than 6 in outside of the 12 in walkway width.    Gait with Horizontal Head Turns Performs head turns smoothly with no change in gait. Deviates no more than 6 in outside 12 in walkway width    Gait with Vertical Head Turns Performs head turns with no change in gait. Deviates no more than 6 in outside 12 in walkway width.    Gait and Pivot Turn Pivot turns safely within 3 sec and stops quickly with no loss of balance.    Step Over Obstacle Is able to step over 2 stacked shoe boxes taped together (9 in total height) without changing gait speed. No evidence of imbalance.    Gait with Narrow Base of Support Is able to ambulate for 10 steps heel to toe with no staggering.    Gait with Eyes Closed Walks 20 ft, uses assistive device, slower speed, mild gait deviations, deviates  6-10 in outside 12 in walkway width. Ambulates 20 ft in less than 9 sec but greater than 7 sec.    Ambulating Backwards Walks 20 ft, uses assistive device, slower speed, mild gait deviations, deviates 6-10 in outside 12 in walkway width.    Steps Alternating feet, no rail.    Total Score 28                         [facility] Adult PT Treatment/Exercise - 04/06/20 1027      Self-Care   Self-Care Other Self-Care Comments    Other Self-Care Comments  Provided her with updated HEP (did not have time to perform) with addition of forward lunges, removed mini squats             Access Code: [email or web address] URL: [email or web address] Date: 04/06/2020 Prepared by: Cameron Sprang  Exercises Seated Shoulder Rolls - 1 x daily - 5-7 x weekly - 2 sets - 10 reps Tandem Walking with Counter Support - 1 x daily - 7 x weekly - 1 sets - 4 reps Sit to Stand without Arm Support - 1 x daily - 7 x weekly - 3 sets - 10 reps Romberg Stance Eyes Closed on Foam Pad - 1 x daily - 7 x weekly - 3 sets - 10 reps Single Leg Stance on Foam Pad - 1 x daily - 7 x weekly - 3 sets - 10 reps Tandem Stance on Foam Pad with Eyes Open - 1 x daily - 7 x weekly - 3 sets - 10 reps Walking Forward Lunge - 1 x daily - 7 x weekly - 1 sets - 10 reps  Did not perform due to time constraint.  Only added walking lunges which we performed last session.         PT Short Term Goals - 03/09/20 1024      PT SHORT TERM GOAL #1   Title Pt will be IND with initial HEP in order to indicate improved function and decreased fall risk.  (Target Date: 03/09/20)    Baseline 03/09/20: met with current program, goes slowly due to fatigue.    Status Achieved    Target Date 03/09/20      PT SHORT TERM GOAL #2   Title Pt will ambulate at mod I level without AD with gait speed of >/=2.62 ft/sec in order  to indicate safe community ambulator.    Baseline 03/09/20: 3.54 ft/sec no AD    Time --    Period --    Status Achieved        PT SHORT TERM GOAL #3   Title Pt will improve 5TSS time to </=13 secs without UE support in order to indicate dec fall risk and improved strength.    Baseline 03/09/20: 14.91 sec's with hands on thighs, improved from 16.56 sec's, just not to goal    Time --    Period --    Status Partially Met      PT SHORT TERM GOAL #4   Title Pt will perform 6MWT and improve distance by 150' in order to indicate improved functional endurance.    Baseline 03/09/20: pt improved her 6 minute walk test by 180 feet (was 1237 feet at baseline, 1427 feet today)    Time --    Period --    Status Achieved      PT SHORT TERM GOAL #5   Title Pt will improve FGA to >/=25/30 in order to indicate decreased fall risk.    Baseline 03/09/20: 25/30 scored today    Time --    Period --    Status Achieved             PT Long Term Goals - 04/06/20 1026      PT LONG TERM GOAL #1   Title Pt will be IND with final HEP in order to indicate improved functional mobility and balance (Target Date: 04/08/20)    Baseline met per pt report    Time 8    Period Weeks    Status Achieved      PT LONG TERM GOAL #2   Title Pt will ambulate with gait speed >/=3.22 ft/sec in order to indicate more normal gait speed.    Baseline 3.93 ft/sec on 04/06/20    Time 8    Period Weeks    Status Achieved      PT LONG TERM GOAL #3   Title Pt will improve 6MWT distance by 300' from baseline in order to indicate improved functional endurnace.    Baseline 1284'  on 04/06/20 (1237 was baseline)    Time 8    Period Weeks    Status Not Met      PT LONG TERM GOAL #4   Title Pt will negotiate up/down 3 flights of stairs with single rail in alternating pattern with no more than 13/20 on RPE scale in order to indicate improved strength and endurance.    Baseline met 04/06/20 without holding rails with only mild fatigue    Time 8    Period Weeks    Status Achieved      PT LONG TERM GOAL #5   Title Pt will improve FGA to >/=28/30 in  order to indicate decreased fall risk.    Baseline 22/30 to 28/30    Time 8    Period Weeks    Status Achieved      PT LONG TERM GOAL #6   Title Pt will tolerate light to moderate standing activity x 30 mins in order to indicate improved endurance and ability to transition back to work.    Baseline met (is not working yet but able to tolerate 30 mins of upright actiivty    Time 8    Period Weeks    Status Achieved  Plan - 04/06/20 1345    Clinical Impression Statement Skilled session focused on assessment of LTGs and D/C.  Pt has met all but 1 LTG (for 6MWT).  She actually had a decreased distance from previous assessment but pt reports feeling more tired today.  Pt reports she plans to return to work next week (we discussed shorter days/part time last week).  Pt ready for d/c at this time.    Personal Factors and Comorbidities Age;Comorbidity 1;Finances;Time since onset of injury/illness/exacerbation;Profession    Comorbidities post covid    Examination-Activity Limitations Carry;Lift;Stand;Stairs;Squat;Locomotion Level;Transfers    Examination-Participation Restrictions Cleaning;Community Activity;Shop;Meal Prep    Stability/Clinical Decision Making Evolving/Moderate complexity    Rehab Potential Good    PT Frequency 2x / week    PT Duration 8 weeks    PT Treatment/Interventions ADLs/Self Care Home Management;Aquatic Therapy;DME Instruction;Gait training;Stair training;Functional mobility training;Therapeutic activities;Therapeutic exercise;Balance training;Neuromuscular re-education;Cognitive remediation;Patient/family education;Passive range of motion;Vestibular    Consulted and Agree with Plan of Care Patient;Other (Comment)           Patient will benefit from skilled therapeutic intervention in order to improve the following deficits and impairments:  Cardiopulmonary status limiting activity, Decreased activity tolerance, Decreased balance, Decreased  cognition, Decreased endurance, Decreased mobility, Decreased strength, Difficulty walking, Impaired perceived functional ability, Impaired flexibility, Impaired sensation, Postural dysfunction, Improper body mechanics  Visit Diagnosis: Muscle weakness (generalized)  Other abnormalities of gait and mobility  Unspecified disturbances of skin sensation  Unsteadiness on feet     Problem List Patient Active Problem List   Diagnosis Date Noted  . Educated about COVID-19 virus infection 04/04/2020  . History of 2019 novel coronavirus disease (COVID-19) 03/17/2020  . GERD (gastroesophageal reflux disease) 03/17/2020  . Physical deconditioning 03/17/2020  . Anxiety 03/17/2020  . Shortness of breath 02/04/2020  . Generalized anxiety disorder 02/04/2020  . Weakness of both lower extremities 02/04/2020  . Chronic nonintractable headache 02/04/2020  . Tremor of both hands 02/04/2020  . History of viral illness 02/04/2020    Cameron Sprang, PT, MPT [facility] [address] [place], [place], [address] Phone: [phone]   Fax:  [phone] 04/06/20, 1:48 PM  Name: Sandra Savage MRN: [id number] Date of Birth: 05/20/1976

## 2020-04-12 NOTE — Telephone Encounter (Signed)
I called the pt via spanish interpreter and advised of results. She verbalized understanding and will keep NCS appt as scheduled for sept.

## 2020-04-19 ENCOUNTER — Ambulatory Visit (HOSPITAL_COMMUNITY): Payer: No Typology Code available for payment source | Attending: Cardiology

## 2020-04-19 ENCOUNTER — Other Ambulatory Visit: Payer: Self-pay

## 2020-04-19 DIAGNOSIS — R0602 Shortness of breath: Secondary | ICD-10-CM | POA: Insufficient documentation

## 2020-04-19 LAB — ECHOCARDIOGRAM COMPLETE
Area-P 1/2: 3.08 cm2
S' Lateral: 3.2 cm

## 2020-04-21 ENCOUNTER — Ambulatory Visit: Payer: Self-pay

## 2020-04-29 ENCOUNTER — Ambulatory Visit
Admission: RE | Admit: 2020-04-29 | Discharge: 2020-04-29 | Disposition: A | Discharge status: Home / Self Care | Payer: No Typology Code available for payment source | Source: Ambulatory Visit | Attending: Neurology | Admitting: Neurology

## 2020-04-29 DIAGNOSIS — R251 Tremor, unspecified: Secondary | ICD-10-CM

## 2020-04-29 DIAGNOSIS — R52 Pain, unspecified: Secondary | ICD-10-CM

## 2020-04-29 DIAGNOSIS — R531 Weakness: Secondary | ICD-10-CM

## 2020-04-29 MED ORDER — GADOBENATE DIMEGLUMINE 529 MG/ML IV SOLN
17.0000 mL | Freq: Once | INTRAVENOUS | Status: AC | PRN
  Administered 2020-04-29: 17 mL via INTRAVENOUS

## 2020-05-02 NOTE — Progress Notes (Signed)
Please advise patient that her brain MRI with and without contrast showed normal for age findings.

## 2020-05-03 ENCOUNTER — Telehealth: Payer: Self-pay

## 2020-05-03 NOTE — Telephone Encounter (Signed)
I called the pt and advised of MRI results via telephone interpreter service Emeline Gins ID # [phone]). Pt verbalized understanding and will keep NCS/EMG appt as scheduled for 05/05/2020.

## 2020-05-03 NOTE — Telephone Encounter (Signed)
-----   Message from Huston Foley, MD sent at 05/02/2020  7:27 AM EDT ----- Please advise patient that her brain MRI with and without contrast showed normal for age findings.

## 2020-05-05 ENCOUNTER — Ambulatory Visit (INDEPENDENT_AMBULATORY_CARE_PROVIDER_SITE_OTHER): Payer: Self-pay | Admitting: Neurology

## 2020-05-05 DIAGNOSIS — R251 Tremor, unspecified: Secondary | ICD-10-CM

## 2020-05-05 DIAGNOSIS — Z0289 Encounter for other administrative examinations: Secondary | ICD-10-CM

## 2020-05-05 DIAGNOSIS — R52 Pain, unspecified: Secondary | ICD-10-CM

## 2020-05-05 DIAGNOSIS — R531 Weakness: Secondary | ICD-10-CM

## 2020-05-05 DIAGNOSIS — R29898 Other symptoms and signs involving the musculoskeletal system: Secondary | ICD-10-CM

## 2020-05-05 NOTE — Progress Notes (Signed)
Full Name: Sandra Savage Gender: Female MRN #: [id number] Date of Birth: 1975-12-27    Visit Date: 05/05/2020 08:26 Age: 44 Years Examining Physician: Naomie Dean, MD  Referring Physician: Huston Foley, MD Height: 5 feet 4 inch     History: generalized pain and weakness  Summary: EMG nerve conduction studies were performed on the left upper and left lower extremity including cervical paraspinal muscles.All nerves and muscles (as indicated in the following tables) were within normal limits.    Conclusion: This is a normal study.  No electrophysiologic evidence for mononeuropathy, polyneuropathy, radiculopathy, myopathy/myositis or other muscle disease.  Naomie Dean, M.D.  [facility] [address] [place], [place] [address] Tel: [phone] Fax: [phone]  Verbal informed consent was obtained from the patient, patient was informed of potential risk of procedure, including bruising, bleeding, hematoma formation, infection, muscle weakness, muscle pain, numbness, among others.         MNC    Nerve / Sites Muscle Latency Ref. Amplitude Ref. Rel Amp Segments Distance Velocity Ref. Area    ms ms mV mV %  cm m/s m/s mVms  L Median - APB     Wrist APB 3.9 ?4.4 6.3 ?4.0 100 Wrist - APB 7   18.4     Upper arm APB 7.4  6.1  97 Upper arm - Wrist 20 58 ?49 18.0  L Ulnar - ADM     Wrist ADM 2.6 ?3.3 12.4 ?6.0 100 Wrist - ADM 7   32.4     B.Elbow ADM 5.2  10.0  80.6 B.Elbow - Wrist 18 70 ?49 29.2     A.Elbow ADM 6.7  9.5  94.5 A.Elbow - B.Elbow 10 65 ?49 29.8         A.Elbow - Wrist      L Peroneal - EDB     Ankle EDB 3.9 ?6.5 8.1 ?2.0 100 Ankle - EDB 9   26.5     Fib head EDB 9.0  5.4  66 Fib head - Ankle 27 52 ?44 17.4     Pop fossa EDB 11.0  5.5  102 Pop fossa - Fib head 10 52 ?44 18.4         Pop fossa - Ankle      L Tibial - AH     Ankle AH 4.0 ?5.8 18.2 ?4.0 100 Ankle - AH 9   27.3     Pop fossa AH 11.7  14.2  78.2 Pop fossa - Ankle 36 47 ?41  23.6             SNC    Nerve / Sites Rec. Site Peak Lat Ref.  Amp Ref. Segments Distance Peak Diff Ref.    ms ms V V  cm ms ms  L Sural - Ankle (Calf)     Calf Ankle 3.7 ?4.4 26 ?6 Calf - Ankle 14    L Superficial peroneal - Ankle     Lat leg Ankle 3.5 ?4.4 9 ?6 Lat leg - Ankle 14    L Median, Ulnar - Transcarpal comparison     Median Palm Wrist 2.0 ?2.2 68 ?35 Median Palm - Wrist 8       Ulnar Palm Wrist 1.8 ?2.2 46 ?12 Ulnar Palm - Wrist 8          Median Palm - Ulnar Palm  0.2 ?0.4  L Median - Orthodromic (Dig II, Mid palm)     Dig II  Wrist 2.8 ?3.4 31 ?10 Dig II - Wrist 13    L Ulnar - Orthodromic, (Dig V, Mid palm)     Dig V Wrist 2.5 ?3.1 24 ?5 Dig V - Wrist 11                 F  Wave    Nerve F Lat Ref.   ms ms  L Tibial - AH 43.6 ?56.0  L Ulnar - ADM 23.8 ?32.0         EMG Summary Table    Spontaneous MUAP Recruitment  Muscle IA Fib PSW Fasc Other Amp Dur. Poly Pattern  L. Iliopsoas Normal None None None _______ Normal Normal Normal Normal  L. Vastus medialis Normal None None None _______ Normal Normal Normal Normal  L. Tibialis anterior Normal None None None _______ Normal Normal Normal Normal  L. Gastrocnemius (Medial head) Normal None None None _______ Normal Normal Normal Normal  L. Gluteus maximus Normal None None None _______ Normal Normal Normal Normal  L. Gluteus medius Normal None None None _______ Normal Normal Normal Normal  L. Biceps femoris (long head) Normal None None None _______ Normal Normal Normal Normal  Bilat. Lumbar paraspinals (low) Normal None None None _______ Normal Normal Normal Normal  Bilat. Cervical paraspinals (low) Normal None None None _______ Normal Normal Normal Normal  L. Biceps brachii Normal None None None _______ Normal Normal Normal Normal  L. Deltoid Normal None None None _______ Normal Normal Normal Normal  L. Triceps brachii Normal None None None _______ Normal Normal Normal Normal  L. Pronator teres Normal None None None  _______ Normal Normal Normal Normal  L. First dorsal interosseous Normal None None None _______ Normal Normal Normal Normal  L. Opponens pollicis Normal None None None _______ Normal Normal Normal Normal      

## 2020-05-05 NOTE — Procedures (Signed)
Full Name: Sandra Savage Gender: Female MRN #: [id number] Date of Birth: 1975-12-27    Visit Date: 05/05/2020 08:26 Age: 44 Years Examining Physician: Naomie Dean, MD  Referring Physician: Huston Foley, MD Height: 5 feet 4 inch     History: generalized pain and weakness  Summary: EMG nerve conduction studies were performed on the left upper and left lower extremity including cervical paraspinal muscles.All nerves and muscles (as indicated in the following tables) were within normal limits.    Conclusion: This is a normal study.  No electrophysiologic evidence for mononeuropathy, polyneuropathy, radiculopathy, myopathy/myositis or other muscle disease.  Naomie Dean, M.D.  [facility] [address] [place], [place] [address] Tel: [phone] Fax: [phone]  Verbal informed consent was obtained from the patient, patient was informed of potential risk of procedure, including bruising, bleeding, hematoma formation, infection, muscle weakness, muscle pain, numbness, among others.         MNC    Nerve / Sites Muscle Latency Ref. Amplitude Ref. Rel Amp Segments Distance Velocity Ref. Area    ms ms mV mV %  cm m/s m/s mVms  L Median - APB     Wrist APB 3.9 ?4.4 6.3 ?4.0 100 Wrist - APB 7   18.4     Upper arm APB 7.4  6.1  97 Upper arm - Wrist 20 58 ?49 18.0  L Ulnar - ADM     Wrist ADM 2.6 ?3.3 12.4 ?6.0 100 Wrist - ADM 7   32.4     B.Elbow ADM 5.2  10.0  80.6 B.Elbow - Wrist 18 70 ?49 29.2     A.Elbow ADM 6.7  9.5  94.5 A.Elbow - B.Elbow 10 65 ?49 29.8         A.Elbow - Wrist      L Peroneal - EDB     Ankle EDB 3.9 ?6.5 8.1 ?2.0 100 Ankle - EDB 9   26.5     Fib head EDB 9.0  5.4  66 Fib head - Ankle 27 52 ?44 17.4     Pop fossa EDB 11.0  5.5  102 Pop fossa - Fib head 10 52 ?44 18.4         Pop fossa - Ankle      L Tibial - AH     Ankle AH 4.0 ?5.8 18.2 ?4.0 100 Ankle - AH 9   27.3     Pop fossa AH 11.7  14.2  78.2 Pop fossa - Ankle 36 47 ?41  23.6             SNC    Nerve / Sites Rec. Site Peak Lat Ref.  Amp Ref. Segments Distance Peak Diff Ref.    ms ms V V  cm ms ms  L Sural - Ankle (Calf)     Calf Ankle 3.7 ?4.4 26 ?6 Calf - Ankle 14    L Superficial peroneal - Ankle     Lat leg Ankle 3.5 ?4.4 9 ?6 Lat leg - Ankle 14    L Median, Ulnar - Transcarpal comparison     Median Palm Wrist 2.0 ?2.2 68 ?35 Median Palm - Wrist 8       Ulnar Palm Wrist 1.8 ?2.2 46 ?12 Ulnar Palm - Wrist 8          Median Palm - Ulnar Palm  0.2 ?0.4  L Median - Orthodromic (Dig II, Mid palm)     Dig II  Wrist 2.8 ?3.4 31 ?10 Dig II - Wrist 13    L Ulnar - Orthodromic, (Dig V, Mid palm)     Dig V Wrist 2.5 ?3.1 24 ?5 Dig V - Wrist 53                 F  Wave    Nerve F Lat Ref.   ms ms  L Tibial - AH 43.6 ?56.0  L Ulnar - ADM 23.8 ?32.0         EMG Summary Table    Spontaneous MUAP Recruitment  Muscle IA Fib PSW Fasc Other Amp Dur. Poly Pattern  L. Iliopsoas Normal None None None _______ Normal Normal Normal Normal  L. Vastus medialis Normal None None None _______ Normal Normal Normal Normal  L. Tibialis anterior Normal None None None _______ Normal Normal Normal Normal  L. Gastrocnemius (Medial head) Normal None None None _______ Normal Normal Normal Normal  L. Gluteus maximus Normal None None None _______ Normal Normal Normal Normal  L. Gluteus medius Normal None None None _______ Normal Normal Normal Normal  L. Biceps femoris (long head) Normal None None None _______ Normal Normal Normal Normal  Bilat. Lumbar paraspinals (low) Normal None None None _______ Normal Normal Normal Normal  Bilat. Cervical paraspinals (low) Normal None None None _______ Normal Normal Normal Normal  L. Biceps brachii Normal None None None _______ Normal Normal Normal Normal  L. Deltoid Normal None None None _______ Normal Normal Normal Normal  L. Triceps brachii Normal None None None _______ Normal Normal Normal Normal  L. Pronator teres Normal None None None  _______ Normal Normal Normal Normal  L. First dorsal interosseous Normal None None None _______ Normal Normal Normal Normal  L. Opponens pollicis Normal None None None _______ Normal Normal Normal Normal

## 2020-05-05 NOTE — Progress Notes (Signed)
Spanish interpreter needed. Please call and advise the patient that the recent EMG and nerve conduction velocity test, which is the electrical nerve and muscle test we we performed, was reported as within normal limits. We checked for abnormal electrical discharges in the muscles or nerves and the report suggested normal findings. No further action is required on this test at this time. Please remind patient to keep any upcoming appointments or tests and to call us with any interim questions, concerns, problems or updates. Thanks,  Huston Foley, MD, PhD

## 2020-05-05 NOTE — Progress Notes (Signed)
See procedure note.

## 2020-05-09 ENCOUNTER — Telehealth: Payer: Self-pay

## 2020-05-09 NOTE — Telephone Encounter (Signed)
-----   Message from Huston Foley, MD sent at 05/05/2020  1:58 PM EDT ----- Spanish interpreter needed. Please call and advise the patient that the recent EMG and nerve conduction velocity test, which is the electrical nerve and muscle test we we performed, was reported as within normal limits. We checked for abnormal electrical discharges in the muscles or nerves and the report suggested normal findings. No further action is required on this test at this time. Please remind patient to keep any upcoming appointments or tests and to call us with any interim questions, concerns, problems or updates. Thanks,  Huston Foley, MD, PhD

## 2020-05-09 NOTE — Telephone Encounter (Signed)
I called the pt via telephone interpreter ( nancy) and advised of results.  Pt verbalized understanding and will keep f/u as scheduled for November with our office.

## 2020-07-05 ENCOUNTER — Ambulatory Visit (INDEPENDENT_AMBULATORY_CARE_PROVIDER_SITE_OTHER): Payer: No Typology Code available for payment source | Admitting: Nurse Practitioner

## 2020-07-05 VITALS — BP 114/82 | HR 80 | Temp 97.1°F | Ht 64.0 in | Wt 191.0 lb

## 2020-07-05 DIAGNOSIS — R0602 Shortness of breath: Secondary | ICD-10-CM

## 2020-07-05 DIAGNOSIS — R5381 Other malaise: Secondary | ICD-10-CM

## 2020-07-05 DIAGNOSIS — F411 Generalized anxiety disorder: Secondary | ICD-10-CM

## 2020-07-05 DIAGNOSIS — Z8619 Personal history of other infectious and parasitic diseases: Secondary | ICD-10-CM

## 2020-07-05 DIAGNOSIS — F419 Anxiety disorder, unspecified: Secondary | ICD-10-CM

## 2020-07-05 DIAGNOSIS — R519 Other chronic pain: Secondary | ICD-10-CM

## 2020-07-05 DIAGNOSIS — R251 Tremor, unspecified: Secondary | ICD-10-CM

## 2020-07-05 DIAGNOSIS — G8929 Other chronic pain: Secondary | ICD-10-CM

## 2020-07-05 MED ORDER — GABAPENTIN 100 MG PO CAPS: 300.0000 mg | ORAL_CAPSULE | Freq: Every day | ORAL | 0 refills | Status: DC

## 2020-07-05 MED ORDER — HYDROXYZINE HCL 25 MG PO TABS: 25.0000 mg | ORAL_TABLET | Freq: Every evening | ORAL | 0 refills | Status: DC | PRN

## 2020-07-05 NOTE — Progress Notes (Signed)
@Patient  ID: Sandra Savage, female    DOB: 1975/10/24, 45 y.o.   MRN: [id number]  Chief Complaint  Patient presents with  . Follow-up    Still having fatigue, SOB working part time but feels like she needs time off. Requetsing refill on hydroxyzine    Referring provider: Fenton Foy, NP   44 year old female with history of anxiety and hypercholesteremia.  HPI  Patient presents today for post COVID care clinic visit follow-up.  Spanish interpreter was used for visit.  Patient states that she had Covid in June 2020 but was never tested.  There were 3 other family members who were sick with Covid at the same time.  Patient was last seen here on 7 2321.  Patient has had full work-up by cardiology, pulmonary, neurology.  Overall all tests have come back normal.  She has been participating in physical therapy but states that this was recently discontinued.  Patient has returned to work part-time but is requesting a note for time off from work due to complaints of still having fatigue and shortness of breath.  We discussed that a lot of her issues may be due to anxiety.  Patient was referred to psychiatry at last visit but failed to call them back to make the appointment.  She has been out of her hydroxyzine.  We will refill this today.  Patient does need to be set up with a primary care physician.  We will set her up appointment with a PCP before the end of the visit today.  Patient states that she has been trying to walk for 30 minutes daily. Denies f/c/s, n/v/d, hemoptysis, PND, chest pain or edema.        No Known Allergies   There is no immunization history on file for this patient.  Past Medical History:  Diagnosis Date  . Anxiety   . Fatty liver   . Hypercholesteremia     Tobacco History: Social History   Tobacco Use  Smoking Status Never Smoker  Smokeless Tobacco Never Used   Counseling given: Yes   Outpatient Encounter Medications as of 07/05/2020  Medication  Sig  . budesonide-formoterol (SYMBICORT) 80-4.5 MCG/ACT inhaler Inhale 2 puffs into the lungs 2 (two) times daily.  . Cyanocobalamin (B-12 COMPLIANCE INJECTION) 1000 MCG/ML KIT Inject as directed once a week.  . gabapentin (NEURONTIN) 100 MG capsule Take 3 capsules (300 mg total) by mouth daily.  . [DISCONTINUED] gabapentin (NEURONTIN) 100 MG capsule Take 300 mg by mouth daily.   . DULoxetine (CYMBALTA) 30 MG capsule Take 1 capsule (30 mg total) by mouth daily.  . hydrOXYzine (ATARAX/VISTARIL) 25 MG tablet Take 1 tablet (25 mg total) by mouth at bedtime as needed.  Marland Kitchen omeprazole (PRILOSEC) 20 MG capsule Take 1 capsule (20 mg total) by mouth daily. (Patient not taking: Reported on 07/05/2020)  . [DISCONTINUED] hydrOXYzine (ATARAX/VISTARIL) 25 MG tablet Take 1 tablet (25 mg total) by mouth at bedtime as needed. (Patient not taking: Reported on 07/05/2020)   No facility-administered encounter medications on file as of 07/05/2020.     Review of Systems  Review of Systems  Constitutional: Positive for fatigue. Negative for fever.  HENT: Negative.   Respiratory: Positive for shortness of breath. Negative for cough.   Cardiovascular: Negative.  Negative for chest pain, palpitations and leg swelling.  Gastrointestinal: Negative.   Allergic/Immunologic: Negative.   Neurological: Negative.   Psychiatric/Behavioral: Negative.        Physical Exam  BP 114/82 (BP  Location: Left Arm)   Pulse 80   Temp (!) 97.1 F (36.2 C)   Ht 5' 4"  (1.626 m)   Wt 191 lb (86.6 kg)   SpO2 100%   BMI 32.79 kg/m   Wt Readings from Last 5 Encounters:  07/05/20 191 lb (86.6 kg)  04/05/20 185 lb 9.6 oz (84.2 kg)  04/04/20 185 lb (83.9 kg)  03/17/20 186 lb 3.2 oz (84.5 kg)  03/04/20 186 lb 0.1 oz (84.4 kg)     Physical Exam Vitals and nursing note reviewed.  Constitutional:      General: She is not in acute distress.    Appearance: She is well-developed.  Cardiovascular:     Rate and Rhythm: Normal  rate and regular rhythm.  Pulmonary:     Effort: Pulmonary effort is normal.     Breath sounds: Normal breath sounds.  Neurological:     Mental Status: She is alert and oriented to person, place, and time.        Assessment & Plan:   History of viral illness History of Viral Illness - most likely Covid - never tested - will check antibodies Shortness of breath:  Please keep Follow up with pulmonary  Continue Symbicort - 2 puffs twice daily  Can not tolerate albuterol   Headache Weakness to lower extremities Tremors:  Continue to follow neurology  Continue Neuro Rehab  May start Magnesium   Anxiety:  Please return call to psychiatry to schedule appointment  Could not tolerate Cymbalta  Will order hydroxyzine      Follow up:  Follow up if needed      Fenton Foy, NP 07/05/2020

## 2020-07-05 NOTE — Patient Instructions (Addendum)
History of viral illness History of Viral Illness - most likely Covid - never tested - will check antibodies Shortness of breath:  Please keep Follow up with pulmonary  Continue Symbicort - 2 puffs twice daily  Can not tolerate albuterol   Headache Weakness to lower extremities Tremors:  Continue to follow neurology  Continue Neuro Rehab  May start Magnesium   Anxiety:  Please return call to psychiatry to schedule appointment  Could not tolerate Cymbalta  Will order hydroxyzine      Follow up:  Follow up if needed

## 2020-07-05 NOTE — Assessment & Plan Note (Signed)
History of Viral Illness - most likely Covid - never tested - will check antibodies Shortness of breath:  Please keep Follow up with pulmonary  Continue Symbicort - 2 puffs twice daily  Can not tolerate albuterol   Headache Weakness to lower extremities Tremors:  Continue to follow neurology  Continue Neuro Rehab  May start Magnesium   Anxiety:  Please return call to psychiatry to schedule appointment  Could not tolerate Cymbalta  Will order hydroxyzine      Follow up:  Follow up if needed

## 2020-07-12 ENCOUNTER — Encounter: Payer: Self-pay | Admitting: Family Medicine

## 2020-07-12 ENCOUNTER — Other Ambulatory Visit: Payer: Self-pay

## 2020-07-12 ENCOUNTER — Ambulatory Visit (INDEPENDENT_AMBULATORY_CARE_PROVIDER_SITE_OTHER): Payer: Self-pay | Admitting: Family Medicine

## 2020-07-12 VITALS — BP 123/74 | HR 83 | Ht 64.0 in | Wt 188.0 lb

## 2020-07-12 DIAGNOSIS — R251 Tremor, unspecified: Secondary | ICD-10-CM

## 2020-07-12 DIAGNOSIS — R29898 Other symptoms and signs involving the musculoskeletal system: Secondary | ICD-10-CM
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
## 2020-07-12 NOTE — Patient Instructions (Addendum)
Below is our plan:  We will conitnue to monitor symptoms. Please discuss any concerns of anxiety with primary care. May consider a daily anti anxiety medication as you seem to note benefit with hydroxyzine.   Please make sure you are staying well hydrated. I recommend 50-60 ounces daily. Well balanced diet and regular exercise encouraged.    Please continue follow up with care team as directed.   Follow up as needed   You may receive a survey regarding today's visit. I encourage you to leave honest feed back as I do use this information to improve patient care. Thank you for seeing me today!        Control de la [employer] adultos Managing Anxiety, Adult Despus de haber sido diagnosticado con trastorno de ansiedad, podra sentirse aliviado por comprender por qu se haba sentido o haba actuado de cierto modo. Es posible que tambin se sienta abrumado por el tratamiento que tiene por delante y por lo que este significar para su vida. Con atencin y [place], [place] afeccin y recuperarse. Cmo manejar los cambios en el estilo de vida Control del estrs y la ansiedad  El estrs es la reaccin del cuerpo ante los cambios y los acontecimientos de la vida, tanto buenos [place]. La [employer] Farm de los episodios de estrs duran slo algunas horas, pero el estrs puede ser continuo y Forensic psychologist a ms que solo estrs. Aunque el estrs puede desempear un papel importante en la ansiedad, no es lo mismo que la ansiedad. El estrs generalmente es causado por algo externo, como una fecha lmite, una prueba o una competencia. El estrs normalmente pasa despus de que el evento desencadenante ha terminado.  La ansiedad es causada por algo interno, por ejemplo, imaginar un resultado terrible o preocuparse porque algo ir mal y lo devastar. A menudo, la ansiedad no desaparece incluso despus de que el evento desencadenante ha finalizado y puede tornarse en una preocupacin a largo plazo  (crnica). Es importante comprender las diferencias entre el estrs y la ansiedad, y Chief Technology Officer el estrs de manera efectiva para que no genere una respuesta de ansiedad. Hable con el mdico o un consejero para obtener ms informacin sobre cmo reducir la ansiedad y Dealer. Es posible que el profesional sugiera tcnicas para reducir la tensin, tales como:  Musicoterapia. Esto podra incluir crear o escuchar msica que disfrute y lo inspire.  Meditacin consciente. Esto implica prestar atencin a la respiracin normal sin intentar controlarla. Puede realizarse mientras est sentado o camina.  Oracin centrante. Esto implica centrarse en una palabra, frase o imagen sagrada que le signifique algo y le genere paz.  Respiracin profunda. Para hacer esto, expanda el estmago e inhale lentamente por la nariz. Cataract unos 3a5segundos. Luego, exhale lentamente mientras deja que los msculos del estmago se relajen.  Dilogo interno. Esto implica identificar patrones de pensamiento que provocan reacciones de ansiedad y cambiar esos patrones.  Relajacin muscular. Esto implica tensar los msculos y, [place], [place]. Elija una tcnica para reducir la tensin que se adapte a su estilo de vida y su personalidad. Estas tcnicas llevan tiempo y prctica. Resrvese de 5a71mnutos por da para hAmbulance person Algunos terapeutas pueden ofrecer orientacin y capacitacin en estas tcnicas. Es posible que algunos planes de seguros mdicos cubran la capacitacin. Otras cosas que puede hacer para controlar el estrs y la ansiedad incluyen:  Llevar un diario de estrs/ansiedad. Esto puede ayudarlo a identificar qu le desencadena su reaccin y, luego, aprender  las [employer] de controlar su respuesta al [place].  Pensar en cmo reacciona ante ciertas situaciones. Es posible que no sea capaz de Chief Technology Officer todo, pero puede Environmental education officer.  Hacerse tiempo para las actividades que lo ayudan a  Nurse, children's y no sentir culpa por pasar su tiempo de [place].  La formacin de imgenes visuales y el yoga pueden ayudarlo a Theatre manager la calma y [place].  Medicamentos Los medicamentos pueden ayudar a [employer]. Algunos medicamentos para la ansiedad:  Teacher, adult education ansiedad.  Antidepresivos. A menudo, los medicamentos se usan como tratamiento primario para el trastorno de [place]. Un mdico recetar los medicamentos. Cuando se usan juntos, los medicamentos, la psicoterapia y las tcnicas de reduccin de la tensin pueden ser el tratamiento ms efectivo. [place] interpersonales pueden ser muy importantes para ayudar a su recuperacin. Intente pasar ms tiempo interactuando con amigos y familiares de [place]. Considere la posibilidad de ir a terapia de pareja, tomar clases de educacin familiar o ir a Careers information officer. La terapia puede ayudarlos a usted y a los dems a comprender mejor su afeccin. Cmo Museum/gallery conservator en su ansiedad Cada persona responde de [place] diferente al tratamiento de la ansiedad. Se dice que est recuperado de la ansiedad cuando los sntomas disminuyen y dejan de Cabin crew en las actividades diarias en el hogar o [place]. Esto podra significar que usted comenzar a Field seismologist lo siguiente:  Associate Professor y atencin. Tener menos interferencia de la preocupacin en el pensamiento diario.  Dormir mejor.  Estar menos irritable.  Tener ms energa.  Tener [employer]. Es Glass blower/designer cundo el trastorno [place]. Comunquese con el mdico si sus sntomas interfieren en su hogar o su trabajo, y usted siente que su afeccin no est mejorando. Siga estas instrucciones en su casa: Actividad  Realizar actividad fsica. La [employer] Farm de los adultos debe hacer lo siguiente: ? Optometrist, al [place], 148mnutos de actividad fsica por semana. El ejercicio debe aumentar la frecuencia cardaca y hNature conservation officertranspirar  (ejercicio de intensidad moderada). ? Realizar ejercicios de fortalecimiento por lo mHalliburton Companypor semana.  Dormir bien y por el tiempo adecuado. La mState Farmde los adultos necesitan entre 7y9horas de sueo todas las noches. Estilo de vida   Siga una dieta saludable que incluya abundantes frutas, verduras, cereales integrales, productos lcteos descremados y protenas magras. No consuma muchos alimentos ricos en grasas slidas, azcares agregados o sal.  Opte por cosas que le simplifiquen la vida.  No consuma ningn producto que contenga nicotina o tabaco, como cigarrillos, cigarrillos electrnicos y tabaco de mHigher education careers adviser Si necesita ayuda para dejar de fumar, consulte al mdico.  Evite el consumo de cafena, alcohol y ciertos medicamentos contra el resfro de venta sin receta. Estos podran hEngineer, building services Pregntele al farmacutico qu medicamentos no debera tomar. Instrucciones generales  TDelphide venta libre y los recetados solamente como se lo haya indicado el mdico.  CConsulting civil engineera todas las visitas de seguimiento como se lo haya indicado el mdico. Esto es importante. Dnde buscar apoyo Puede conseguir ayuda y aNational Oilwell Varcosiguientes lugares:  Grupos de aGordon  Organizaciones comunitarias y en lnea.  Un lder espiritual de confianza.  Terapia de pareja.  Clases de educacin familiar.  Terapia familiar. Dnde buscar ms informacin Formar parte de un grupo de apoyo podra resultarle til para enfrentar la ansiedad. Las siguientes fuentes pueden ayudarlo a lOrthoptistconsejeros o grupos de a62cerca de su hogar:  [place]  [place] (Salud Mental de los [place]): www.mentalhealthamerica.net  Anxiety and Depression Association of [place] [ADAA] (Asociacin de Ansiedad y Depresin de los Estados Unidos): https://www.clark.net/  National Alliance on Mental Illness [NAMI] ([place]): www.nami.org Comunquese con  un mdico si:  Le resulta difcil permanecer concentrado o finalizar las tareas diarias.  Pasa muchas horas por da sintindose preocupado por la vida cotidiana.  La preocupacin le provoca un cansancio extremo.  Comienza a tener dolores de [place] o nuseas, o a sentirse tenso.  Orina ms de lo normal.  Tiene diarrea. Solicite ayuda inmediatamente si tiene:  Latidos cardacos acelerados y falta de aire.  Pensamientos acerca de Sport and exercise psychologist a Producer, television/film/video. Si alguna vez siente que puede lastimarse o Market researcher a Producer, television/film/video, o tiene pensamientos de poner fin a su vida, busque ayuda de inmediato. Puede dirigirse al servicio de emergencias ms cercano o comunicarse con:  El servicio de emergencias de su localidad (911 en EE.UU.).  Una lnea de asistencia al suicida y Freight forwarder en crisis, como National Suicide Prevention Lifeline ([place]), al [phone]. Est disponible las 24 horas del da. Resumen  Tomar medidas para aprender y usar tcnicas de reduccin de la tensin puede ayudarlo a calmarse y a Conservation officer, historic buildings reaccin de ansiedad.  Cuando se usan juntos, los medicamentos, la psicoterapia y las tcnicas de reduccin de la tensin pueden ser el tratamiento ms efectivo.  Los familiares, los amigos y las parejas pueden tener un lugar importante en su recuperacin del trastorno de ansiedad. Esta informacin no tiene Marine scientist el consejo del mdico. Asegrese de hacerle al mdico cualquier pregunta que tenga. Document Revised: 02/04/2019 Document Reviewed: 02/04/2019 Elsevier Patient Education  [place] Fatigue Si siente fatiga, tiene una sensacin de cansancio en todo momento y falta de energa o falta de motivacin. La fatiga puede dificultar el inicio o la realizacin de tareas debido al agotamiento. Por lo general, la fatiga ocasional o leve con frecuencia es una reaccin normal a la actividad  o la vida. Sin embargo, la fatiga de Engineer, site duracin (crnica) o extrema puede ser sntoma de una enfermedad. Siga estas indicaciones en su casa: Instrucciones generales  Controle su fatiga para ver si hay cambios.  Acustese y levntese a la misma hora todos [place].  Evite la fatiga moderando su ritmo [place] y durmiendo lo suficiente durante la noche.  Mantenga un peso saludable. [place] los medicamentos de venta libre y los recetados solamente como se lo haya indicado el mdico.  Tome una multivitamina, si se lo indic el mdico.  No tome ningn complemento alimenticio o a base de hierbas a menos que lo haya autorizado el mdico. Actividad   Realice ejercicio con regularidad como se lo haya indicado el mdico.  Practique tcnicas que lo ayuden a Nurse, children's, como yoga, tai chi, meditacin, terapia de masajes. Comida y bebida   Evite las comidas abundantes a la noche.  Consuma una dieta bien balanceada que incluya protenas magras, cereales integrales, abundantes frutas, verduras y productos lcteos descremados.  Evite el consumo excesivo de cafena.  Evite el consumo de alcohol.  Beba suficiente lquido para Consulting civil engineer orina de color amarillo plido. [place] situaciones que le provocan estrs. Trate de que su horario de [place] y personal estn equilibrados.  No consuma ningn producto que contenga nicotina o tabaco, como cigarrillos y Psychologist, sport and exercise. Si necesita ayuda para dejar  de fumar, consulte al mdico.  No consuma drogas. Comunquese con un mdico si:  La fatiga no mejora.  Tiene fiebre.  Pierde peso o [place] de peso de forma repentina.  Tiene dolores de [place].  Tiene problemas para conciliar el sueo o para dormir en la noche.  Se siente enojado, culpable, ansioso o triste.  No puede defecar (estreimiento).  Tiene la piel seca.  Observa hinchazn en las piernas u otras partes del  cuerpo. Solicite ayuda de inmediato si:  Se siente confundido.  Tiene visin borrosa.  Tiene mareos o se desmaya.  Tiene un dolor de cabeza intenso.  Siente dolor intenso en el abdomen, la espalda o el rea entre la cintura y las caderas (pelvis).  Tiene dolor de pecho, dificultad para respirar, o latidos cardacos irregulares o acelerados.  No puede orinar u [employer] de lo normal.  Presenta sangrado anormal, como sangrado del recto, la vagina, la nariz, los pulmones o los pezones.  Vomita sangre.  Tiene pensamientos acerca de lastimarse a usted mismo o a Producer, television/film/video. Si alguna vez siente que puede lastimarse o Market researcher a los dems, o tiene pensamientos de poner fin a su vida, busque ayuda de inmediato. Puede dirigirse al servicio de emergencias ms cercano o comunicarse con:  El servicio de Therapist, sports (911 en los Estados Unidos).  Una lnea de asistencia al suicida y Freight forwarder en crisis, como la [employer] de Prevencin del Suicidio (National Suicide Prevention Lifeline) al [phone]. Est disponible las 24 horas del da. Resumen  Si siente fatiga, tiene una sensacin de cansancio en todo momento y falta de energa o falta de motivacin.  La fatiga puede dificultar el inicio o la realizacin de tareas debido al agotamiento.  La fatiga de larga duracin (crnica) o extrema puede ser sntoma de una enfermedad.  Realice ejercicio con regularidad como se lo haya indicado el mdico.  Cambie las situaciones que le provocan estrs. Trate de que su horario de [place] y personal estn equilibrados. Esta informacin no tiene Marine scientist el consejo del mdico. Asegrese de hacerle al mdico cualquier pregunta que tenga. Document Revised: 07/17/2017 Document Reviewed: 07/17/2017 Elsevier Patient Education  [place] para [employer] sano Exercising to Stay Healthy Para estar sano y [place] as, es recomendable hacer  ejercicio de intensidad moderada y de intensidad [place]. Puede saber si est haciendo ejercicio de intensidad moderada si su corazn comienza a latir ms rpido y su respiracin se vuelve ms rpida, pero an [place]. Puede saber que est haciendo ejercicio de intensidad vigorosa si respira con mucha ms dificultad y rapidez, y no puede Theatre manager una conversacin. Hacer actividad fsica con regularidad es muy importante. Tiene muchos otros beneficios, como por ejemplo:  Mejora el estado fsico general, la flexibilidad y la resistencia.  Aumenta la densidad sea.  Ayuda a Technical sales engineer.  Disminuye la Air traffic controller.  Aumenta la fuerza muscular.  Reduce el estrs y las tensiones.  Mejora el estado de salud general. Con qu frecuencia debera hacer ejercicio? Elija una actividad que disfrute y establezca objetivos realistas. El mdico puede ayudarlo a Paediatric nurse un plan de actividades que funcione para usted. Haga actividad fsica habitualmente como se lo haya indicado el mdico. Esto puede incluir lo siguiente:  Hacer ejercicios de fortalecimiento muscular dos veces a la semana, como: ? Levantamiento de pesas. ? Usar bandas elsticas de resistencia. ? Flexiones de [employer]. ? Abdominales. ? Yoga.  Realizar ejercicio de [employer]  cierta intensidad durante una cantidad determinada de [place]. Elija entre estas opciones: ? Un total de 16mnutos de ejercicio de intensidad moderada cada semana. ? Un total de 779mutos de ejercicio de intensidad vigorosa cada semana. ? UnWaldron Labse ejercicio de intensidad moderada y vigorosa cada semana. Los nios, las mujeres emWeston Millslas personas que no han hecho actividad fsica con regularidad, las personas que tienen sobrepeso y los adultos mayores tal vez tengan que consultar a un mdico sobre qu actividades son seguras para haField seismologistSi tiene alCommercial Metals Companyasegrese de coTeacher, adult educationl mdico antes de comenzar un programa de  ejercicios nuevo. Cules son algunas ideas para hacer ejercicio? Algunas ideas de ejercicio de intensidad moderada incluyen:  Caminar 1 milla (1.6 kilmetros) en aproximadamente 15 minutos.  Andar en bicicleta.  Practicar senderismo.  Jugar al golf.  Bailar.  Gimnasia acutica. Algunas ideas de ejercicio de intensidad vigorosa incluyen:  Caminar 4.5 millas (7.2 kilmetros) o ms en aproximadamente una hora.  Trotar o correr 5 millas (8 kilmetros) en aproximadamente una hora.  Andar en bicicleta 10 millas (16,1 kilmetros) o ms en aproximadamente una hora.  Practicar natacin.  Practicar patinaje de ruedas o en lnea.  Hacer esqu de fondo.  Hacer deportes competitivos vigorosos, como ftbol americano, bsquet y ftbol.  Saltar la cuerda.  Tomar clases de baile aeLucinda Dellon algunas actividades diarias que pueden ayudarme a ejercitarme?  Trabajar en el jardn, como: ? Empujar una cortadora de csped. ? Juntar y embolsar hojas.  Lavar el automvil.  Empujar un cochecito.  Palear nieve.  CuBrightonLavar las ventanas o los pisos. Cmo puedo ser ms activo en mis actividades diarias?  Utilice las esClinical cytogeneticistel ascensor.  Vaya a caminar durante su hora de almuerzo.  Si conduce, estacione el automvil ms lejos del trabajo o de la escuela.  Si usCanadaransporte pblico, bjese una parada antes y camine el resto del camino.  Pngase de pie o camine durante todas las llamadas telefnicas que haga mientras est adentro.  Levntese, estrese y camine cada 304mtos a lo largo del da.Training and development officerHaga ejercicio con un [place] apoyo para continuar haciendo ejercicio lo ayudar a manTheatre managera rutina de actividad frecuente. Qu pautas puedo seguir mientras hago ejercicio?  Hable con el mdico antes de comenzar un programa nuevo de actHuber Heightsica.  No haga ejercicio en exceso que pudiera hacer que se lastime, se sienta mareado o tenga  dificultad para respirar.  Use ropa cmoda y calzado con buen soporte.  Beba gran cantidad de agua mientras hace ejercicio para evitar la deshidratacin o los golpes de calFreight forwarderHaga ejercicio hasta que se aceleren su respiracin y sus latidos cardacos. Dnde encontrar ms informacin  Departamento de Salud y Servicios Humanos de los Estados Unidos (U.S. Department of Health and HumFinancial controllerwwwBondedCompany.atentros para el Building surveyorla Publishing copy EnfProbation officerr Disease Control and Prevention, CDC): wwwhttp://www.wolf.info/sumen  Hacer actividad fsica con regularidad es muy importante. Mejorar el estado fsico general, la flexibilidad y la resistencia.  El ejercicio regular tambin mejora la salud general. PueMerilynn Finlandcontrolar su peso, reducir el estrs y mejTeacher, English as a foreign language densidad sea.  No haga ejercicio en exceso que pudiera hacer que se lastime, se sienta mareado o tenga dificultad para respirar.  Hable con el mdico antes de comenzar un programa nuevo de actGroesbeckica. Esta informacin no tiene comMarine scientist consejo del mdico. Asegrese de hacerle al mdico cualquier pregunta  que tenga. Document Revised: 09/07/2017 Document Reviewed: 09/07/2017 Elsevier Patient Education  2020 [employer].

## 2020-07-19 ENCOUNTER — Encounter: Payer: Self-pay | Admitting: Family Medicine

## 2020-07-19 ENCOUNTER — Other Ambulatory Visit: Payer: Self-pay

## 2020-07-19 ENCOUNTER — Ambulatory Visit (INDEPENDENT_AMBULATORY_CARE_PROVIDER_SITE_OTHER): Payer: No Typology Code available for payment source | Admitting: Family Medicine

## 2020-07-19 VITALS — BP 120/57 | HR 85 | Temp 98.3°F | Resp 18 | Ht 64.0 in | Wt 191.0 lb

## 2020-07-19 DIAGNOSIS — F411 Generalized anxiety disorder: Secondary | ICD-10-CM

## 2020-07-19 DIAGNOSIS — Z8616 Personal history of COVID-19: Secondary | ICD-10-CM

## 2020-07-19 DIAGNOSIS — R0602 Shortness of breath: Secondary | ICD-10-CM

## 2020-07-19 DIAGNOSIS — Z09 Encounter for follow-up examination after completed treatment for conditions other than malignant neoplasm: Secondary | ICD-10-CM

## 2020-07-19 DIAGNOSIS — Z7689 Persons encountering health services in other specified circumstances: Secondary | ICD-10-CM

## 2020-07-19 DIAGNOSIS — R251 Tremor, unspecified: Secondary | ICD-10-CM

## 2020-07-19 LAB — POCT URINALYSIS DIP (CLINITEK)
Bilirubin, UA: NEGATIVE
Blood, UA: NEGATIVE
Glucose, UA: NEGATIVE mg/dL
Ketones, POC UA: NEGATIVE mg/dL
Leukocytes, UA: NEGATIVE
Nitrite, UA: NEGATIVE
POC PROTEIN,UA: NEGATIVE
Spec Grav, UA: >=1.03 — AB (ref 1.010–1.025)
Urobilinogen, UA: 0.2 E.U./dL
pH, UA: 6.5 (ref 5.0–8.0)

## 2020-07-19 LAB — POCT GLYCOSYLATED HEMOGLOBIN (HGB A1C): Hemoglobin A1C: 5 % (ref 4.0–5.6)

## 2020-07-19 MED ORDER — PROPRANOLOL HCL 10 MG PO TABS: 10.0000 mg | ORAL_TABLET | Freq: Three times a day (TID) | ORAL | 0 refills | Status: DC

## 2020-07-19 NOTE — Progress Notes (Signed)
Needs RF on gabapentin & hydroxyzine   Wants to discuss increased appetite, thinks could be coming from medications  Wants to discuss daytime medication for anxiety that does not cause drowsiness

## 2020-07-19 NOTE — Patient Instructions (Signed)
Temblores Tremor Un temblor es un estremecimiento o una sacudida que no Magazine features editor. La [employer]ley[employer]Davidson de los temblores afectan las manos o los brazos. Tambin pueden afectar la cabeza, las cuerdas vocales, la cara y otras partes del cuerpo. Hay muchos tipos de temblores. Entre los ms frecuentes, se incluyen los siguientes:  Temblores hereditarios. Por lo general, ocurren en las Smith International de 40 aos de edad. Pueden transmitirse en la familia y afectar a personas sanas.  Temblores estticos. Se producen cuando los msculos estn en reposo, por ejemplo, al apoyar las manos sobre el regazo. Las personas con enfermedad de Parkinson suelen tener temblores estticos.  Temblores posturales. Se producen al tratar de [employer], por ejemplo, sostener los brazos estirados.  Temblores cinticos. Aparecen durante el movimiento voluntario, por ejemplo, al tratar de llevar un dedo hacia la punta de la [place].  Temblores especficos de una tarea. Pueden producirse al realizar ciertas tareas como escribir, hablar o estar de pie.  Temblores psicgenos. Estos desaparecen o se reducen considerablemente cuando la persona se distrae. Pueden presentarse en personas de todas las edades. Algunos tipos de temblores no tienen una causa conocida. Los temblores tambin pueden ser un sntoma de problemas en el sistema nervioso (trastornos neurolgicos) que aparecen con el envejecimiento. Algunos temblores desaparecen con tratamiento, mientras que otros no. Siga estas indicaciones en su casa: Estilo de vida      Limite el consumo de alcohol a no ms de por da si es mujer y no est [place], y a por da si es hombre. Una medida equivale a 12oz ( ) de cerveza, 5oz ( ) de vino o 1oz (40ml) de bebidas alcohlicas de alta graduacin.  No consuma ningn producto que contenga nicotina o tabaco, como cigarrillos y Administrator, Civil Service. Si necesita ayuda para dejar de fumar,  consulte al mdico.  Evite el calor y el fro extremos.  Limite el consumo de cafena, segn se lo haya indicado el mdico.  Trate de dormir 8horas todas las noches.  Encuentre la forma de controlar el estrs, por ejemplo, a travs de la meditacin o el yoga. Instrucciones generales  B[employer] de venta libre y los recetados solamente como se lo haya indicado el mdico.  Oceanographer a todas las visitas de seguimiento como se lo haya indicado el mdico. Esto es importante. Comunquese con un mdico si:  Presenta temblores despus de comenzar un medicamento nuevo.  Tiene temblores junto con otros sntomas, como los siguientes: ? Entumecimiento. ? Hormigueo. ? Dolor. ? Debilidad.  Nota que los temblores [place].  Nota que los temblores interfieren en su vida cotidiana. Resumen  Un temblor es un estremecimiento o una sacudida que no Magazine features editor.  La Harley[employer]Davidson de los temblores afectan las manos o los brazos.  Algunos tipos de temblores no tienen una causa conocida. Otros pueden ser un sntoma de problemas en el sistema nervioso (trastornos neurolgicos).  Asegrese de Chiropractor con el mdico cualquier tipo de temblor que tenga. Esta informacin no tiene Theme park manager el consejo del mdico. Asegrese de hacerle al mdico cualquier pregunta que tenga. Document Revised: 11/09/2017 Document Reviewed: 08/12/2017 Elsevier Patient Education  2020 Elsevier Inc. Propranolol Tablets Qu es Du Pont medicamento? El PROPRANOLOL es un betabloqueador. Reduce la carga de trabajo del corazn y lo ayuda a latir a un ritmo regular. Se [place] para tratar la presin sangunea alta y/o prevenir el dolor en el pecho (tambin llamado angina de pecho). Tambin se [place] despus de un ataque cardiaco para prevenir  otroCleda Clarks medicamento puede ser utilizado para otros usos; si tiene alguna pregunta consulte con su proveedor de atencin mdica o con su farmacutico. MARCAS COMUNES: Inderal Qu  le debo informar a mi profesional de la salud antes de tomar este medicamento? Necesita saber si usted presenta alguno de los siguientes problemas o situaciones:  problemas circulatorios o enfermedad vascular  diabetes  antecedentes de ataques o enfermedades cardacas, angina vasospstica  enfermedad heptica  enfermedad renal  enfermedad pulmonar o respiratoria, tales como enfisema o asma  feocromocitoma  frecuencia cardiaca lenta  enfermedad tiroidea  una reaccin alrgica o inusual al propranolol, a otros beta-bloqueantes, medicamentos, alimentos, colorantes o conservantes  si est embarazada o buscando quedar embarazada  si est amamantando a un beb Cmo debo utilizar este medicamento? Tome este frmaco por va oral. selo segn las instrucciones en la etiqueta a la misma hora todos [employer]. Siga usndolo a menos que su proveedor de Insurance risk surveyor indique dejar de [place]. Hable con su proveedor de atencin Fisher Scientific uso de este frmaco en nios. Puede requerir atencin especial. Sobredosis: Pngase en contacto inmediatamente con un centro toxicolgico o una sala de urgencia si usted cree que haya tomado demasiado medicamento. ATENCIN: Reynolds American es solo para usted. No comparta este medicamento con nadie. Qu sucede si me olvido de una dosis? Si olvida una dosis, adminstrela lo antes posible. Si es casi la hora de la prxima dosis, administre solo esa dosis. No se administre dosis adicionales o dobles. Qu puede interactuar con este medicamento? No tome esta medicina con ninguno de los siguientes medicamentos:  espino blanco  fenotiazinas, tales como clorpromacina, mesoridazina, proclorperazina, tioridazina Esta medicina tambin puede interactuar con los siguientes medicamentos:  gel de hidrxido de aluminio  antipirina  medicamentos antivirales para el VIH o SIDA  barbitricos, como fenobarbital  ciertos medicamentos para la presin  sangunea, enfermedad cardiaca, pulso cardiaco irregular  cimetidina  ciprofloxacina  diazepam  fluconazol  haloperidol  isoniazida  medicamentos para el colesterol, tales como colestiramina o colestipol  medicamentos para la depresin mental  medicamentos para las migraas, tales como almotriptn, eletriptn, frovatriptn, naratriptn, rizatriptn, sumatriptn, zolmitriptn  los [place], medicamentos para Chief Technology Officer y [place], como el ibuprofeno o naproxeno  fenitona  rifampicina  teniposida  teofilina  medicamentos tiroideos  tolbutamida  warfarina  zileuton Puede ser que esta lista no menciona todas las posibles interacciones. Informe a su profesional de [employer] de [employer] Inc productos a base de hierbas, medicamentos de [place] o suplementos nutritivos que est tomando. Si usted fuma, consume bebidas alcohlicas o si utiliza drogas ilegales, indqueselo tambin a su profesional de [employer]. Algunas sustancias pueden interactuar con su medicamento. A qu debo estar atento al usar PPL Corporation? Visite a su mdico o a su profesional de la salud para que revise su evolucin peridicamente. Revise su presin sangunea y su pulso regularmente. Pregunte a su profesional de la salud cules deben ser su presin sangunea y su pulso, y cundo Sports coach. Puede experimentar somnolencia o mareos. No conduzca, no utilice maquinaria ni haga nada que Scientist, research (life sciences) en estado de alerta hasta que sepa cmo le afecta este frmaco. No se siente ni se ponga de pie con rapidez, especialmente si es un paciente de edad avanzada. Esto reduce el riesgo de mareos o [employer]. El alcohol puede aumentar los mareos y la somnolencia. Evite consumir bebidas alcohlicas. Este medicamento puede aumentar los niveles de Banker. Pregntele a su profesional de  la salud si es necesario realizar cambios en la dieta o en los medicamentos si usted tiene diabetes. No se trate  usted mismo si tiene tos, resfro o Scientist, research (physical sciences) est usando este medicamento sin consultar con su mdico o con su profesional de [employer]. Algunos ingredientes pueden aumentar su presin sangunea. Qu efectos secundarios puedo tener al Boston Scientific este medicamento? Efectos secundarios que debe informar a su mdico o a Producer, television/film/video de la salud tan pronto como sea posible: Therapist, art, como erupcin cutnea, comezn/picazn o urticaria, e hinchazn de la cara, los labios o la lengua problemas para respirar enfriamiento de manos o pies dificultad para conciliar el sueo, pesadillas piel seca y descamada alucinaciones calambres o debilidad muscular signos y sntomas de niveles altos de azcar en la sangre, tales como tener ms sed o apetito, o tener que orinar con mayor frecuencia que lo habitual. Tambin puede sentirse muy cansado o tener visin borrosa. frecuencia cardiaca lenta hinchazn de las piernas y los tobillos vmito Efectos secundarios que generalmente no requieren atencin mdica (infrmelos a su mdico o a su profesional de la salud si persisten o si son molestos): cambios en el deseo o desempeo sexual diarrea ojos secos y doloridos cada del cabello nuseas debilidad o cansancio Puede ser que esta lista no menciona todos los posibles efectos secundarios. Comunquese a su mdico por asesoramiento mdico Hewlett[employer]ard. Usted puede informar los efectos secundarios a la FDA por telfono al 1-800-FDA-1088. Dnde debo guardar mi medicina? Mantenga fuera del alcance de los nios y [place]. Guarde a Sanmina[employer]SCI, entre 20 y 25 grados Celsius (68 y 67 grados Fahrenheit). Proteja de Statistician. Deseche todo el frmaco que no haya utilizado despus de la fecha de vencimiento. ATENCIN: Este folleto es un resumen. Puede ser que no cubra toda la posible informacin. Si usted tiene preguntas acerca de esta medicina, consulte con su mdico, su farmacutico o su profesional  de Radiographer, therapeutic.  2020 Elsevier/Gold Standard (2019-06-03 00:00:00)
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
## 2020-07-21 ENCOUNTER — Encounter: Payer: Self-pay | Admitting: Family Medicine

## 2020-07-27 ENCOUNTER — Telehealth: Payer: Self-pay | Admitting: Family Medicine

## 2020-07-28 NOTE — Telephone Encounter (Signed)
Sent to provider 

## 2020-08-19 ENCOUNTER — Other Ambulatory Visit: Payer: Self-pay

## 2020-08-19 ENCOUNTER — Ambulatory Visit (INDEPENDENT_AMBULATORY_CARE_PROVIDER_SITE_OTHER): Payer: No Typology Code available for payment source | Admitting: Family Medicine

## 2020-08-19 ENCOUNTER — Encounter: Payer: Self-pay | Admitting: Family Medicine

## 2020-08-19 VITALS — BP 118/78 | HR 76 | Temp 97.9°F | Resp 20 | Ht 64.0 in | Wt 193.8 lb

## 2020-08-19 DIAGNOSIS — F411 Generalized anxiety disorder: Secondary | ICD-10-CM

## 2020-08-19 DIAGNOSIS — F419 Anxiety disorder, unspecified: Secondary | ICD-10-CM

## 2020-08-19 DIAGNOSIS — R251 Tremor, unspecified: Secondary | ICD-10-CM

## 2020-08-19 DIAGNOSIS — Z789 Other specified health status: Secondary | ICD-10-CM

## 2020-08-19 DIAGNOSIS — Z758 Other problems related to medical facilities and other health care: Secondary | ICD-10-CM

## 2020-08-19 DIAGNOSIS — R0602 Shortness of breath: Secondary | ICD-10-CM

## 2020-08-19 DIAGNOSIS — R1011 Right upper quadrant pain: Secondary | ICD-10-CM

## 2020-08-19 DIAGNOSIS — Z09 Encounter for follow-up examination after completed treatment for conditions other than malignant neoplasm: Secondary | ICD-10-CM

## 2020-08-19 DIAGNOSIS — Z8616 Personal history of COVID-19: Secondary | ICD-10-CM

## 2020-08-19 MED ORDER — HYDROXYZINE HCL 25 MG PO TABS: 25.0000 mg | ORAL_TABLET | Freq: Three times a day (TID) | ORAL | 3 refills | Status: DC | PRN
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
## 2020-08-20 ENCOUNTER — Encounter: Payer: Self-pay | Admitting: Family Medicine

## 2020-08-22 ENCOUNTER — Ambulatory Visit: Payer: No Typology Code available for payment source

## 2020-10-05 ENCOUNTER — Ambulatory Visit: Payer: No Typology Code available for payment source

## 2020-11-16 ENCOUNTER — Ambulatory Visit: Payer: No Typology Code available for payment source | Admitting: Family Medicine

## 2020-12-16 ENCOUNTER — Ambulatory Visit (INDEPENDENT_AMBULATORY_CARE_PROVIDER_SITE_OTHER): Payer: No Typology Code available for payment source | Admitting: Nurse Practitioner

## 2020-12-16 ENCOUNTER — Encounter: Payer: Self-pay | Admitting: Nurse Practitioner

## 2020-12-16 ENCOUNTER — Other Ambulatory Visit: Payer: Self-pay

## 2020-12-16 VITALS — BP 129/67 | HR 93 | Temp 98.4°F | Ht 64.0 in | Wt 199.0 lb

## 2020-12-16 DIAGNOSIS — R0609 Other forms of dyspnea: Secondary | ICD-10-CM

## 2020-12-16 DIAGNOSIS — F411 Generalized anxiety disorder: Secondary | ICD-10-CM

## 2020-12-16 DIAGNOSIS — F419 Anxiety disorder, unspecified: Secondary | ICD-10-CM

## 2020-12-16 DIAGNOSIS — U099 Post covid-19 condition, unspecified: Secondary | ICD-10-CM

## 2020-12-16 DIAGNOSIS — Z8616 Personal history of COVID-19: Secondary | ICD-10-CM

## 2020-12-16 NOTE — Patient Instructions (Addendum)
Disnea en los adultos Shortness of Breath, Adult La disnea significa que tiene dificultad para respirar. La disnea puede ser un signo de un problema mdico. Siga estas indicaciones en su casa:  Controle si hay algn cambio en sus sntomas.  No consuma ningn producto que contenga nicotina o tabaco, como cigarrillos, cigarrillos electrnicos y tabaco de Theatre manager.  No fume. Fumar puede causar disnea. Si necesita ayuda para dejar de fumar, consulte con el mdico.  Evite todo aquello que puede dificultar la respiracin, por ejemplo: ? Moho. ? Polvo. ? Contaminacin del aire. ? Olores de productos qumicos. ? Cosas que causan sntomas de alergia (alrgenos), si tiene Environmental consultant.  Mantenga la limpieza del espacio en que vive. Use productos que ayuden a Land moho y el polvo.  Descanse todo lo que sea necesario. Retome gradualmente sus actividades habituales.  Tome los medicamentos de venta libre y los recetados solamente como se lo haya indicado el mdico. Esto incluye la oxigenoterapia y los medicamentos inhalados.  Concurra a todas las visitas de [address] se lo haya indicado el mdico. Esto es importante.   Comunquese con un mdico si:  Su afeccin no se alivia tan pronto como se espera.  Le cuesta hacer las actividades cotidianas, incluso despus de Lawyer.  Aparecen nuevos sntomas. Solicite ayuda inmediatamente si:  La disnea empeora.  Tiene dificultad para respirar cuando descansa.  Se siente mareada o se desvanece (se desmaya).  Tiene tos que no se alivia con medicamentos.  Tose y escupe sangre.  Siente dolor al respirar.  Tiene dolor en el pecho, los brazos, los hombros o el vientre (abdomen).  Tiene fiebre.  No puede subir escaleras.  No puede realizar ejercicio del modo en que lo haca habitualmente. Estos sntomas pueden representar un problema grave que constituye Radio broadcast assistant. No espere a ver si los sntomas desaparecen. Solicite atencin  mdica de inmediato. Comunquese con el servicio de emergencias de su localidad (911 en los [place]). No conduzca por sus propios medios [employer]. Resumen  La disnea se produce cuando tiene dificultad para inhalar la suficiente cantidad de aire. Puede ser signo de un problema mdico.  Evite las cosas que le dificultan la respiracin, como el fumar, la contaminacin, el moho y [place].  Controle si hay algn cambio en sus sntomas. Comunquese con el mdico si no mejora o si empeora. Esta informacin no tiene Theme park manager el consejo del mdico. Asegrese de hacerle al mdico cualquier pregunta que tenga. Document Revised: 02/11/2018 Document Reviewed: 02/11/2018 Elsevier Patient Education  2021 Arvin[employer]itor.    Control de la ansiedad en los adultos Managing Anxiety, Adult Despus de haber sido diagnosticado con trastorno de ansiedad, podra sentirse aliviado por comprender por qu se haba sentido o haba actuado de cierto modo. Es posible que tambin se sienta abrumado por el tratamiento que tiene por delante y por lo que este significar para su vida. Con atencin y [place], [place] afeccin y recuperarse. Cmo manejar los cambios en el estilo de vida Control del estrs y la ansiedad El estrs es la reaccin del cuerpo ante los cambios y los acontecimientos de la vida, tanto buenos [place]. La Harley-Davidson de los episodios de estrs duran slo algunas horas, pero el estrs puede ser continuo y Science writer a ms que solo estrs. Aunque el estrs puede desempear un papel importante en la ansiedad, no es lo mismo que la ansiedad. El estrs generalmente es causado por algo externo, como una fecha lmite,  una prueba o una competencia. El estrs normalmente pasa despus de que el evento desencadenante ha terminado.  La ansiedad es causada por algo interno, por ejemplo, imaginar un resultado terrible o preocuparse porque algo ir mal y lo devastar. A menudo, la ansiedad  no desaparece incluso despus de que el evento desencadenante ha finalizado y puede tornarse en una preocupacin a largo plazo (crnica). Es importante comprender las diferencias entre el estrs y la ansiedad, y Chief Operating Officer el estrs de manera efectiva para que no genere una respuesta de ansiedad. Hable con el mdico o un consejero para obtener ms informacin sobre cmo reducir la ansiedad y Development worker, community. Es posible que el profesional sugiera tcnicas para reducir la tensin, tales como:  Musicoterapia. Esto podra incluir crear o escuchar msica que disfrute y lo inspire.  Meditacin consciente. Esto implica prestar atencin a la respiracin normal sin intentar controlarla. Puede realizarse mientras est sentado o camina.  Oracin centrante. Esto implica centrarse en una palabra, frase o imagen sagrada que le signifique algo y le genere paz.  Respiracin profunda. Para hacer esto, expanda el estmago e inhale lentamente por la nariz. Mantenga el aire durante unos 3a5segundos. Luego, exhale lentamente mientras deja que los msculos del estmago se relajen.  Dilogo interno. Esto implica identificar patrones de pensamiento que provocan reacciones de ansiedad y cambiar esos patrones.  Relajacin muscular. Esto implica tensar los msculos y, [place], [place]. Elija una tcnica para reducir la tensin que se adapte a su estilo de vida y su personalidad. Estas tcnicas llevan tiempo y prctica. Resrvese de 5a83minutos por da para Associate Professor. Algunos terapeutas pueden ofrecer orientacin y capacitacin en estas tcnicas. Es posible que algunos planes de seguros mdicos cubran la capacitacin. Otras cosas que puede hacer para controlar el estrs y la ansiedad incluyen:  Llevar un diario de estrs/ansiedad. Esto puede ayudarlo a identificar qu le desencadena su reaccin y, Engineer, mining, aprender las maneras de controlar su respuesta al Panther.  Pensar en cmo reacciona ante ciertas situaciones. Es posible  que no sea capaz de Chief Operating Officer todo, pero puede Corporate investment banker.  Hacerse tiempo para las actividades que lo ayudan a Lexicographer y no sentir culpa por pasar su tiempo de Point.  La formacin de imgenes visuales y el yoga pueden ayudarlo a Pharmacologist la calma y [place].   Medicamentos Los medicamentos pueden ayudar a [employer]. Algunos medicamentos para la ansiedad:  Programme researcher, broadcasting/film/video ansiedad.  Antidepresivos. A menudo, los medicamentos se usan como tratamiento primario para el trastorno de [place]. Un mdico recetar los medicamentos. Cuando se usan juntos, los medicamentos, la psicoterapia y las tcnicas de reduccin de la tensin pueden ser el tratamiento ms efectivo. Las Hess Corporation relaciones interpersonales pueden ser muy importantes para ayudar a su recuperacin. Intente pasar ms tiempo interactuando con amigos y familiares de [place]. Considere la posibilidad de ir a terapia de pareja, tomar clases de educacin familiar o ir a Information systems manager. La terapia puede ayudarlos a usted y a los dems a comprender mejor su afeccin. Cmo Facilities manager en su ansiedad Cada persona responde de [place] diferente al tratamiento de la ansiedad. Se dice que est recuperado de la ansiedad cuando los sntomas disminuyen y dejan de Producer, television/film/video en las actividades diarias en el hogar o [place]. Esto podra significar que usted comenzar a Radio producer lo siguiente:  Dealer y atencin. Tener menos interferencia de la preocupacin en el pensamiento diario.  Dormir mejor.  Estar menos irritable.  Tener ms energa.  Tener Progress Energymejor memoria. Es Public librarianimportante reconocer cundo el trastorno Arcadiaempeora. Comunquese con el mdico si sus sntomas interfieren en su hogar o su trabajo, y usted siente que su afeccin no est mejorando. Siga estas instrucciones en su casa: Actividad  Realizar actividad fsica. La Harley-Davidsonmayora de los adultos debe hacer lo siguiente: ? Education officer, environmentalealizar,  al Bivalvemenos, 150minutos de actividad fsica por semana. El ejercicio debe aumentar la frecuencia cardaca y Media plannerhacerlo transpirar (ejercicio de intensidad moderada). ? Realizar ejercicios de fortalecimiento por lo Rite Aidmenos dos veces por semana.  Dormir bien y por el tiempo adecuado. La Harley-Davidsonmayora de los adultos necesitan entre 7y9horas de sueo todas las noches. Estilo de vida  Siga una dieta saludable que incluya abundantes frutas, verduras, cereales integrales, productos lcteos descremados y protenas magras. No consuma muchos alimentos ricos en grasas slidas, azcares agregados o sal.  Opte por cosas que le simplifiquen la vida.  No consuma ningn producto que contenga nicotina o tabaco, como cigarrillos, cigarrillos electrnicos y tabaco de Theatre managermascar. Si necesita ayuda para dejar de fumar, consulte al mdico.  Evite el consumo de cafena, alcohol y ciertos medicamentos contra el resfro de venta sin receta. Estos podran Optician, dispensinghacerlo sentir peor. Pregntele al farmacutico qu medicamentos no debera tomar.   Instrucciones generales  Baxter Internationalome los medicamentos de venta libre y los recetados solamente como se lo haya indicado el mdico.  OceanographerConcurra a todas las visitas de seguimiento como se lo haya indicado el mdico. Esto es importante. Dnde buscar apoyo Puede conseguir ayuda y M.D.C. Holdingsapoyo en los siguientes lugares:  Grupos de Faxonautoayuda.  Organizaciones comunitarias y en lnea.  Un lder espiritual de confianza.  Terapia de pareja.  Clases de educacin familiar.  Terapia familiar. Dnde buscar ms informacin Formar parte de un grupo de apoyo podra resultarle til para enfrentar la ansiedad. Las siguientes fuentes pueden ayudarlo a Medical laboratory scientific officerlocalizar consejeros o grupos de apoyo cerca de su hogar:  Mental Health America (Salud Mental de los [place] Unidos): www.mentalhealthamerica.net  Anxiety and Depression Association of [place] [ADAA] (Asociacin de Ansiedad y Depresin de los Estados Unidos):  ProgramCam.dewww.adaa.org  The First Americanational Alliance on Mental Illness [NAMI] (Alianza Nacional Sobre Enfermedades Mentales): www.nami.org Comunquese con un mdico si:  Le resulta difcil permanecer concentrado o finalizar las tareas diarias.  Pasa muchas horas por da sintindose preocupado por la vida cotidiana.  La preocupacin le provoca un cansancio extremo.  Comienza a tener dolores de Turkmenistancabeza o nuseas, o a sentirse tenso.  Orina ms de lo normal.  Tiene diarrea. Solicite ayuda inmediatamente si tiene:  Latidos cardacos acelerados y falta de aire.  Pensamientos acerca de Radiographer, therapeuticlastimarse o lastimar a Economistotras personas. Si alguna vez siente que puede lastimarse o Physicist, medicallastimar a Economistotras personas, o tiene pensamientos de poner fin a su vida, busque ayuda de inmediato. Puede dirigirse al servicio de emergencias ms cercano o comunicarse con:  El servicio de emergencias de su localidad (911 en [place]  Una lnea de asistencia al suicida y Visual merchandiseratencin en crisis, como National Suicide Prevention Lifeline (Lnea Nacional de Prevencin del Suicidio), al [phone]. Est disponible las 24 horas del da. Resumen  Tomar medidas para aprender y usar tcnicas de reduccin de la tensin puede ayudarlo a calmarse y a Chiropractorevitar desencadenar una reaccin de ansiedad.  Cuando se usan juntos, los medicamentos, la psicoterapia y las tcnicas de reduccin de la tensin pueden ser el tratamiento ms efectivo.  Los familiares, los amigos y las parejas pueden tener un lugar importante en su recuperacin del trastorno de ansiedad. Esta informacin no tiene  como fin reemplazar el consejo del mdico. Asegrese de hacerle al mdico cualquier pregunta que tenga. Document Revised: 02/04/2019 Document Reviewed: 02/04/2019 Elsevier Patient Education  2021 Elsevier Inc.   The [employer] Tremor Un temblor es un estremecimiento o una sacudida que no Magazine features editor. La Harley-Davidson de los temblores afectan las manos o los brazos. Tambin pueden  afectar la cabeza, las cuerdas vocales, la cara y otras partes del cuerpo. Hay muchos tipos de temblores. Entre los ms frecuentes, se incluyen los siguientes:  Temblores hereditarios. Por lo general, ocurren en las Smith International de 40 aos de edad. Pueden transmitirse en la familia y afectar a personas sanas.  Temblores estticos. Se producen cuando los msculos estn en reposo, por ejemplo, al apoyar las manos sobre el regazo. Las personas con enfermedad de Parkinson suelen tener temblores estticos.  Temblores posturales. Se producen al tratar de U[employer] Corporation, por ejemplo, sostener los brazos estirados.  Temblores cinticos. Aparecen durante el movimiento voluntario, por ejemplo, al tratar de llevar un dedo hacia la punta de la [place].  Temblores especficos de una tarea. Pueden producirse al realizar ciertas tareas como escribir, hablar o estar de pie.  Temblores psicgenos. Estos desaparecen o se reducen considerablemente cuando la persona se distrae. Pueden presentarse en personas de todas las edades. Algunos tipos de temblores no tienen una causa conocida. Los temblores tambin pueden ser un sntoma de problemas en el sistema nervioso (trastornos neurolgicos) que aparecen con el envejecimiento. Algunos temblores desaparecen con tratamiento, mientras que otros no. Sigue estas indicaciones en tu casa: Estilo de vida  Limite el consumo de alcohol a no ms de por da si es mujer y no est [place], y a por da si es hombre. Una medida equivale a 12oz de Financial controller, 5oz de vino o 1oz de bebidas alcohlicas de alta graduacin.  No consuma ningn producto que contenga nicotina o tabaco, como cigarrillos y Administrator, Civil Service. Si necesita ayuda para dejar de fumar, consulte al mdico.  Evite el calor y el fro extremos.  Limite el consumo de cafena, segn se lo haya indicado el mdico.  Trate de dormir 8horas todas las noches.  Encuentre la forma de  controlar el estrs, por ejemplo, a travs de la meditacin o el yoga.      Instrucciones generales  Use los medicamentos de venta libre y los recetados solamente como se lo haya indicado el mdico.  Oceanographer a todas las visitas de seguimiento como se lo haya indicado el mdico. Esto es importante. Comunquese con un mdico si:  Presenta temblores despus de comenzar un medicamento nuevo.  Tiene temblores junto con otros sntomas, como los siguientes: ? Adormecimiento. ? Hormigueo. ? Dolor. ? Debilidad.  Nota que los temblores [place].  Nota que los temblores interfieren en su vida cotidiana. Resumen  Un temblor es un estremecimiento o una sacudida que no Magazine features editor.  La Harley-Davidson de los temblores afectan las manos o los brazos.  Algunos tipos de temblores no tienen una causa conocida. Otros pueden ser un sntoma de problemas en el sistema nervioso (trastornos neurolgicos).  Asegrese de Chiropractor con el mdico cualquier tipo de temblor que tenga. Esta informacin no tiene Theme park manager el consejo del mdico. Asegrese de hacerle al mdico cualquier pregunta que tenga. Document Revised: 06/10/2020 Document Reviewed: 04/26/2020 Elsevier Patient Education  2021 ArvinMeritor.
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
## 2020-12-16 NOTE — Progress Notes (Signed)
Integrated Behavioral Health Case Management Referral Note  12/16/2020 Name: Sandra Savage MRN: [id number] DOB: 05-04-1976 Sandra Savage is a 45 y.o. year old female who sees Azzie Glatter, FNP (Inactive) for primary care. LCSW was consulted to assess patient's needs and assist the patient with Mental Health Counseling and Resources.  Interpreter: Yes.     Interpreter Name & Language: Spanish  Assessment: Patient experiencing Financial constraints related to lack of health coverage and Mental Health Concerns .  Intervention: CSW met with patient during PCP visit. Patient does not have health insurance and is experiencing anxiety related to her health. She has long haul COVID symptoms and has been unable to work. She is interested in counseling. She previously had an [employer] but it expired and she would like to renew it.    Provided patient with [employer] and [employer] applications. Reviewed application and advised patient on supporting documents to be submitted with the application. Advised patient to follow up with CSW for assistance in scheduling appointment with financial counselor at [facility]) to submit the application if needed.   Patient would prefer a Spanish speaking counselor. She is open to meeting with this CSW with interpreter until she can get an appointment with [place] speaking counselor. CSW made follow up social work appointment with patient for next week. CSW awaiting return call from [place] to determine availability of their Spanish Scientist, product/process development. [facility]) does not have a Garment/textile technologist.  Provided [place] and CSW contact information.   Review of patient status, including review of consultants reports, relevant laboratory and other test results, and collaboration with appropriate care team members and the patient's provider was performed as part of comprehensive  patient evaluation and provision of services.    SDOH (Social Determinants of Health) assessments performed: No    Goals Addressed   None      Follow up Plan: 1. Awaiting return call from [place] to determine if they have Spanish speaking counselor available 2. CSW to meet with patient for short term support while identifying Spanish speaking counselor; appointment with patient 12/22/20  Sandra Savage, [facility] [phone]

## 2020-12-19 ENCOUNTER — Telehealth: Payer: Self-pay | Admitting: Clinical

## 2020-12-19 NOTE — Telephone Encounter (Signed)
Integrated Behavioral Health General Follow Up Note  12/19/2020 Name: Anieya Helman MRN: [id number] DOB: 1975-08-14 Gwendoline Judy is a 45 y.o. year old female who sees Kallie Locks, FNP (Inactive) for primary care. LCSW was initially consulted to assist the patient with Mental Health Counseling and Resources.  Interpreter: Yes.     Interpreter Name & Language: [employer] [phone] & [phone]  Assessment: Patient experiencing Financial constraints related to lack of health coverage and Mental Health Concerns.  Ongoing Intervention: Today patient called CSW and requested assistance in scheduling with financial counseling for the [facility] application. CSW called [employer] [facility]) and assisted in getting patient scheduled for this.  Also asked to confirm follow up social work appointment with patient; patient wanted to reschedule for next week. CSW continues to await follow up from [facility] of the [place] about the availability of their Spanish speaking counselors, as this is patient's preference.  Review of patient status, including review of consultants reports, relevant laboratory and other test results, and collaboration with appropriate care team members and the patient's provider was performed as part of comprehensive patient evaluation and provision of services.     Follow up Plan: 1. Awaiting return call from [facility] of the [place] to determine if they have Spanish speaking counselor available 2. CSW to meet with patient for short term support while identifying Spanish speaking counselor; appointment with patient 12/28/20  Abigail Butts, LCSW [facility] [phone]

## 2020-12-22 ENCOUNTER — Other Ambulatory Visit: Payer: Self-pay | Admitting: Clinical

## 2020-12-27 ENCOUNTER — Ambulatory Visit: Payer: Self-pay | Attending: Family Medicine

## 2020-12-27 ENCOUNTER — Other Ambulatory Visit: Payer: Self-pay

## 2020-12-28 ENCOUNTER — Ambulatory Visit: Payer: No Typology Code available for payment source | Admitting: Clinical

## 2020-12-28 DIAGNOSIS — F411 Generalized anxiety disorder: Secondary | ICD-10-CM

## 2020-12-28 NOTE — Progress Notes (Signed)
Integrated Behavioral Health General Follow Up Note  12/28/2020 Name: Sandra Savage MRN: [id number] DOB: July 29, 1976 Sandra Savage is a 44 y.o. year old female who sees Vevelyn Francois, NP for primary care. LCSW was initially consulted to assist the patient with Mental Health Counseling and Resources.  Interpreter: Yes.     Interpreter Name & Language: [place] interpreter, Rosaria Ferries [phone]  Objective: Mood: Euthymic & Affect: Appropriate  Patient and/or Family's Strengths/Protective Factors: Concrete supports in place (healthy food, safe environments, etc.)  Assessment: Patient experiencing anxiety exacerbated by long lasting COVID symptoms. Patient would prefer a Spanish speaking counselor.  Ongoing Intervention: Today CSW met with patient at the [facility] with help of video interpreter. Provided brief supportive counseling and mindful breathing exercise for calming and grounding for anxiety. Patient engaged in the exercise.  Referred patient to [facility] of the [place] ([facility]), which has a Garment/textile technologist. Patient preferred to make the appointment on her own; provided patient with contact information for [facility]. Patient anxiety also exacerbated by inability to work as much due to fatigue, difficulty breathing. She has reduced her working hours and reports she is able to pay her bills for the most part, but has had to rely on some assistance. Referred patient to [facility] for assistance applying for food stamps.  Review of patient status, including review of consultants reports, relevant laboratory and other test results, and collaboration with appropriate care team members and the patient's provider was performed as part of comprehensive patient evaluation and provision of services.     Follow up Plan: 1. CSW to follow up with patient by phone to follow up on referrals and assist as necessary.   Estanislado Emms, [facility] [phone]

## 2021-01-05 ENCOUNTER — Telehealth: Payer: Self-pay | Admitting: Clinical

## 2021-01-05 NOTE — Telephone Encounter (Signed)
Integrated Behavioral Health General Follow Up Note  01/05/2021 Name: Sandra Savage MRN: [id number] DOB: 1976-05-16 Sandra Savage is a 45 y.o. year old female who sees Barbette Merino, NP for primary care. LCSW was initially consulted to assist the patient with Mental Health Counseling and Resources.  Interpreter: Yes.     Interpreter Name & Language: [address], Jake Shark [phone]  Assessment: Patient experiencing anxiety exacerbated by long lasting COVID symptoms.   Ongoing Intervention: Today CSW called patient to follow up. Patient reported she has not yet called [facility] [place] for scheduling with a Spanish speaking counselor but will plan to do so in the next day or two.   CSW referred patient to [employer] for assistance with basic needs. She has not been able to work as much due to long COVID symptoms. Also referred to [employer] if needing assistance with food; unfortunately not eligible for food stamps.  Patient had questions about her [employer] application that she submitted. CSW advised that patient's application is approved and Erskine Emery Card will be mailed to her per the [employer].   Review of patient status, including review of consultants reports, relevant laboratory and other test results, and collaboration with appropriate care team members and the patient's provider was performed as part of comprehensive patient evaluation and provision of services.     Follow up Plan: 1. CSW available from clinic as needed  Abigail Butts, LCSW Patient Care Center [facility] [phone]

## 2021-01-17 ENCOUNTER — Ambulatory Visit
Admission: EM | Admit: 2021-01-17 | Discharge: 2021-01-17 | Disposition: A | Discharge status: Home / Self Care | Payer: No Typology Code available for payment source | Attending: Emergency Medicine | Admitting: Emergency Medicine

## 2021-01-17 DIAGNOSIS — R5383 Other fatigue: Secondary | ICD-10-CM

## 2021-01-17 DIAGNOSIS — F419 Anxiety disorder, unspecified: Secondary | ICD-10-CM

## 2021-01-17 MED ORDER — HYDROXYZINE HCL 25 MG PO TABS: 25.0000 mg | ORAL_TABLET | Freq: Three times a day (TID) | ORAL | 0 refills | Status: DC | PRN

## 2021-01-17 NOTE — Discharge Instructions (Signed)
Blood work pending I will call with results only if abnormal Hydroxyzine refilled  Please follow-up with neurology for further evaluation and treatment of tremor Follow-up with pulmonology as planned

## 2021-01-17 NOTE — ED Triage Notes (Signed)
Patient presents to Urgent Care with complaints of fatigue and tremors since being dx with covid. Pt states symptoms were worse on Saturday. Pt states she has a appt with pulmonary specialist this month.
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
## 2021-01-18 LAB — COMPREHENSIVE METABOLIC PANEL
ALT: 24 IU/L (ref 0–32)
AST: 17 IU/L (ref 0–40)
Albumin/Globulin Ratio: 1.7 (ref 1.2–2.2)
Albumin: 4.4 g/dL (ref 3.8–4.8)
Alkaline Phosphatase: 108 IU/L (ref 44–121)
BUN/Creatinine Ratio: 12 (ref 9–23)
BUN: 8 mg/dL (ref 6–24)
Bilirubin Total: <0.2 mg/dL (ref 0.0–1.2)
CO2: 19 mmol/L — ABNORMAL LOW (ref 20–29)
Calcium: 9.8 mg/dL (ref 8.7–10.2)
Chloride: 106 mmol/L (ref 96–106)
Creatinine, Ser: 0.66 mg/dL (ref 0.57–1.00)
Globulin, Total: 2.6 g/dL (ref 1.5–4.5)
Glucose: 119 mg/dL — ABNORMAL HIGH (ref 65–99)
Potassium: 4 mmol/L (ref 3.5–5.2)
Sodium: 144 mmol/L (ref 134–144)
Total Protein: 7 g/dL (ref 6.0–8.5)
eGFR: 109 mL/min/{1.73_m2} (ref >59)

## 2021-01-18 LAB — CBC WITH DIFFERENTIAL/PLATELET
Basophils Absolute: 0.1 10*3/uL (ref 0.0–0.2)
Basos: 1 %
EOS (ABSOLUTE): 0.1 10*3/uL (ref 0.0–0.4)
Eos: 1 %
Hematocrit: 43.2 % (ref 34.0–46.6)
Hemoglobin: 14.4 g/dL (ref 11.1–15.9)
Immature Grans (Abs): 0 10*3/uL (ref 0.0–0.1)
Immature Granulocytes: 0 %
Lymphocytes Absolute: 1.9 10*3/uL (ref 0.7–3.1)
Lymphs: 25 %
MCH: 27.7 pg (ref 26.6–33.0)
MCHC: 33.3 g/dL (ref 31.5–35.7)
MCV: 83 fL (ref 79–97)
Monocytes Absolute: 0.6 10*3/uL (ref 0.1–0.9)
Monocytes: 8 %
Neutrophils Absolute: 5 10*3/uL (ref 1.4–7.0)
Neutrophils: 65 %
Platelets: 301 10*3/uL (ref 150–450)
RBC: 5.2 x10E6/uL (ref 3.77–5.28)
RDW: 12.1 % (ref 11.7–15.4)
WBC: 7.6 10*3/uL (ref 3.4–10.8)

## 2021-01-18 LAB — VITAMIN D 25 HYDROXY (VIT D DEFICIENCY, FRACTURES): Vit D, 25-Hydroxy: 47.4 ng/mL (ref 30.0–100.0)

## 2021-01-18 LAB — VITAMIN B12: Vitamin B-12: >2000 pg/mL — ABNORMAL HIGH (ref 232–1245)

## 2021-01-18 LAB — TSH: TSH: 1.36 u[IU]/mL (ref 0.450–4.500)

## 2021-01-20 ENCOUNTER — Telehealth: Payer: Self-pay | Admitting: Nurse Practitioner

## 2021-01-20 NOTE — Telephone Encounter (Signed)
Called patient she stated she is still feeling very fatigue and weak also having lots of tremors. She went to urgent care regarding this on 6/7. She wants to know if there is something else she can take.

## 2021-01-20 NOTE — Telephone Encounter (Signed)
-----   Message from Barbette Merino, NP sent at 01/20/2021 12:08 PM EDT ----- Sandra Savage  Please make the patient aware that Gabapentin can cause some weight gain but it is important if needed. Thanks  ----- Message ----- From: Abigail Butts, [place] Sent: 01/03/2021  11:11 AM EDT To: Barbette Merino, NP  Hi [name], this patient had questions after her last visit with you. She reports weight gain and is wondering if its her gabapentin causing this. I am following to get her connected with a Spanish speaking counselor and other resources but she did have this question last time I talked to her. Any info appreciated, thanks!

## 2021-01-23 NOTE — Telephone Encounter (Signed)
Spoke with patient she stated she would like to be weaned off gabapentin.

## 2021-01-23 NOTE — Telephone Encounter (Signed)
Patient advised of weaning process, verbally understood no additional questions.

## 2021-02-06 ENCOUNTER — Ambulatory Visit (INDEPENDENT_AMBULATORY_CARE_PROVIDER_SITE_OTHER): Payer: No Typology Code available for payment source

## 2021-02-06 ENCOUNTER — Other Ambulatory Visit: Payer: Self-pay

## 2021-02-06 ENCOUNTER — Ambulatory Visit (INDEPENDENT_AMBULATORY_CARE_PROVIDER_SITE_OTHER): Payer: No Typology Code available for payment source | Admitting: Pulmonary Disease

## 2021-02-06 ENCOUNTER — Encounter: Payer: Self-pay | Admitting: Pulmonary Disease

## 2021-02-06 VITALS — BP 126/70 | HR 81 | Ht 65.0 in | Wt 198.2 lb

## 2021-02-06 DIAGNOSIS — R0602 Shortness of breath: Secondary | ICD-10-CM

## 2021-02-06 DIAGNOSIS — R5381 Other malaise: Secondary | ICD-10-CM

## 2021-02-06 DIAGNOSIS — Z8616 Personal history of COVID-19: Secondary | ICD-10-CM

## 2021-02-06 LAB — CBC WITH DIFFERENTIAL/PLATELET
Basophils Absolute: 0 10*3/uL (ref 0.0–0.1)
Basophils Relative: 0.6 % (ref 0.0–3.0)
Eosinophils Absolute: 0.1 10*3/uL (ref 0.0–0.7)
Eosinophils Relative: 1 % (ref 0.0–5.0)
HCT: 40.6 % (ref 36.0–46.0)
Hemoglobin: 13.7 g/dL (ref 12.0–15.0)
Lymphocytes Relative: 24.3 % (ref 12.0–46.0)
Lymphs Abs: 1.8 10*3/uL (ref 0.7–4.0)
MCHC: 33.7 g/dL (ref 30.0–36.0)
MCV: 82 fl (ref 78.0–100.0)
Monocytes Absolute: 0.6 10*3/uL (ref 0.1–1.0)
Monocytes Relative: 8.2 % (ref 3.0–12.0)
Neutro Abs: 5 10*3/uL (ref 1.4–7.7)
Neutrophils Relative %: 65.9 % (ref 43.0–77.0)
Platelets: 258 10*3/uL (ref 150.0–400.0)
RBC: 4.95 Mil/uL (ref 3.87–5.11)
RDW: 12.6 % (ref 11.5–15.5)
WBC: 7.5 10*3/uL (ref 4.0–10.5)

## 2021-02-06 NOTE — Patient Instructions (Signed)
Continue Symbicort.  Used 1 puff twice daily Start Singulair at night Check CBC differential, IgE and chest x-ray today Return to clinic in 3 months with PFTs

## 2021-02-06 NOTE — Addendum Note (Signed)
Addended by: Arvilla Market on: 02/06/2021 11:15 AM   Modules accepted: Orders

## 2021-02-06 NOTE — Addendum Note (Signed)
Addended by: Demetrio Lapping E on: 02/06/2021 11:19 AM   Modules accepted: Orders

## 2021-02-06 NOTE — Progress Notes (Signed)
Sandra Savage    [id number]    1976/02/09  Primary Care Physician:King, Shana Chute, NP  Referring Physician: Barbette Merino, NP [address] [place] [address] [place],  [place] [address]  Chief complaint: Consult for dyspnea  HPI: 45 year old with history of asthma, GERD, anxiety Reports developing COVID-19 infection in early 2020.  Her family tested positive for COVID after gathering.  She herself did not get tested until 1 month later when she was negative for PCR.  Subsequent COVID IgG antibodies is negative as well  Ever since this incident she has increasing dyspnea on exertion, talking, exercise.  Started on Symbicort which helps minimally with symptoms  She was previously seen in pulmonary office in 2020 with high-res CT and PFTs with no evidence of interstitial lung disease   Pets: No pets Occupation: Part-time hairdresser Exposures: No mold, hot tub, Jacuzzi.  No feather pillows or comforters Smoking history: Non-smoker Travel history: Immigrated from [place] around 2005.  No significant recent travel Relevant family history: No family history of lung disease   Outpatient Encounter Medications as of 02/06/2021  Medication Sig   budesonide-formoterol (SYMBICORT) 80-4.5 MCG/ACT inhaler Inhale 2 puffs into the lungs 2 (two) times daily.   gabapentin (NEURONTIN) 300 MG capsule Take 1 capsule by mouth daily.   hydroxyurea (HYDREA) 500 MG capsule Take 500 mg by mouth 2 (two) times daily.   hydrOXYzine (ATARAX/VISTARIL) 25 MG tablet Take 1 tablet (25 mg total) by mouth 3 (three) times daily as needed.   [DISCONTINUED] propranolol (INDERAL) 10 MG tablet Take 1 tablet (10 mg total) by mouth 3 (three) times daily. (Patient not taking: No sig reported)   No facility-administered encounter medications on file as of 02/06/2021.    Allergies as of 02/06/2021   (No Known Allergies)    Past Medical History:  Diagnosis Date   Anxiety    Coronavirus infection 02/2020   Fatty  liver    Hypercholesteremia     Past Surgical History:  Procedure Laterality Date   APPENDECTOMY     MANDIBLE FRACTURE SURGERY     domestic     Family History  Problem Relation Age of Onset   High blood pressure Mother     Social History   Socioeconomic History   Marital status: Single    Spouse name: Not on file   Number of children: Not on file   Years of education: Not on file   Highest education level: Not on file  Occupational History   Not on file  Tobacco Use   Smoking status: Never   Smokeless tobacco: Never  Substance and Sexual Activity   Alcohol use: Never   Drug use: Never   Sexual activity: Never  Other Topics Concern   Not on file  Social History Narrative   Lives at home alone.     Social Determinants of Health   Financial Resource Strain: Not on file  Food Insecurity: Not on file  Transportation Needs: Not on file  Physical Activity: Not on file  Stress: Not on file  Social Connections: Not on file  Intimate Partner Violence: Not on file    Review of systems: Review of Systems  Constitutional: Negative for fever and chills.  HENT: Negative.   Eyes: Negative for blurred vision.  Respiratory: as per HPI  Cardiovascular: Negative for chest pain and palpitations.  Gastrointestinal: Negative for vomiting, diarrhea, blood per rectum. Genitourinary: Negative for dysuria, urgency, frequency and hematuria.  Musculoskeletal: Negative for myalgias, back pain and joint pain.  Skin: Negative for itching and rash.  Neurological: Negative for dizziness, tremors, focal weakness, seizures and loss of consciousness.  Endo/Heme/Allergies: Negative for environmental allergies.  Psychiatric/Behavioral: Negative for depression, suicidal ideas and hallucinations.  All other systems reviewed and are negative.  Physical Exam: Blood pressure 126/70, pulse 81, height 5\' 5"  (1.651 m), weight 198 lb 3.2 oz (89.9 kg), last menstrual period 01/07/2021, SpO2 98  %. Gen:      No acute distress HEENT:  EOMI, sclera anicteric Neck:     No masses; no thyromegaly Lungs:    Clear to auscultation bilaterally; normal respiratory effort CV:         Regular rate and rhythm; no murmurs Abd:      + bowel sounds; soft, non-tender; no palpable masses, no distension Ext:    No edema; adequate peripheral perfusion Skin:      Warm and dry; no rash Neuro: alert and oriented x 3 Psych: normal mood and affect  Data Reviewed: Imaging: High-resolution CT 07/03/2019-no interstitial lung disease, mild atelectasis in the right middle lobe and lingula.  I have reviewed the images personally.  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  No evidence of interstitial lung disease on prior high-res CT and PFTs  She continues on Symbicort with minimal improvement.  She has gained some weight while on the inhaled corticosteroids We will continue Symbicort at dose of 80, use 1 puff twice daily Add Singulair  Recheck CBC differential, IgE and chest x-ray today Schedule PFTs Advised weight loss with diet and exercise which will help with her symptoms of dyspnea.  Plan/Recommendations: Continue Symbicort, add Singulair CBC, IgE, chest x-ray PFTs  14/10/2018 MD [facility] 02/06/2021, 10:41 AM  CC: 02/08/2021, NP

## 2021-02-07 LAB — IGE: IgE (Immunoglobulin E), Serum: 2 kU/L (ref <=114)

## 2021-02-08 ENCOUNTER — Encounter: Payer: Self-pay | Admitting: *Deleted

## 2021-02-14 ENCOUNTER — Ambulatory Visit (HOSPITAL_COMMUNITY): Payer: Self-pay | Admitting: Clinical

## 2021-03-16 ENCOUNTER — Other Ambulatory Visit: Payer: Self-pay

## 2021-03-16 ENCOUNTER — Ambulatory Visit: Payer: No Typology Code available for payment source | Attending: Nurse Practitioner | Admitting: Nurse Practitioner

## 2021-03-16 DIAGNOSIS — R251 Tremor, unspecified: Secondary | ICD-10-CM

## 2021-03-16 DIAGNOSIS — Z8619 Personal history of other infectious and parasitic diseases: Secondary | ICD-10-CM

## 2021-03-16 NOTE — Patient Instructions (Addendum)
History of viral illness Headache Weakness to lower extremities Tremors:   Will place new referral to neurology       Anxiety:   Please follow with psychiatry        Follow up:   Follow up if needed

## 2021-03-16 NOTE — Progress Notes (Signed)
Virtual Visit via Telephone Note  I connected with Sandra Savage on 03/16/21 at  1:50 PM EDT by telephone and verified that I am speaking with the correct person using two identifiers.  Location: Patient: home Provider: office   I discussed the limitations, risks, security and privacy concerns of performing an evaluation and management service by telephone and the availability of in person appointments. I also discussed with the patient that there may be a patient responsible charge related to this service. The patient expressed understanding and agreed to proceed.  Note: Patient was unable to connect to video visit. This visit was completed as telephone visit.   History of Present Illness:  Patient presents today for post COVID care clinic visit follow-up.  Spanish interpreter was used for visit.  Patient states that she had Covid in June 2020 but was never tested.  There were 3 other family members who were sick with Covid at the same time.  Patient was last seen here on 07/05/20.  Patient has had full work-up by cardiology, pulmonary, neurology.  Overall all tests have come back normal.  She did participate in physical therapy but states that this was recently discontinued. Patient failed to follow up in our office as directed.  Patient states that she continues to have dizziness, headaches, tremors, fatigue.  Patient does have past history of anxiety.  Patient would like a new referral back to neurology for a second work-up.  Patient is very concerned about having tremors. Denies f/c/s, n/v/d, hemoptysis, PND, chest pain or edema.     Observations/Objective:  Vitals with BMI 02/06/2021 01/17/2021 12/16/2020  Height 5\' 5"  - 5\' 4"   Weight 198 lbs 3 oz - 199 lbs  BMI 32.98 - 34.14  Systolic 126 120  Diastolic 70 86 67  Pulse 81 97 93      Assessment and Plan:  History of viral illness Headache Weakness to lower extremities Tremors:   Will place new referral to  neurology       Anxiety:   Please follow with psychiatry        Follow up:   Follow up if needed    I discussed the assessment and treatment plan with the patient. The patient was provided an opportunity to ask questions and all were answered. The patient agreed with the plan and demonstrated an understanding of the instructions.   The patient was advised to call back or seek an in-person evaluation if the symptoms worsen or if the condition fails to improve as anticipated.  I provided 23 minutes of non-face-to-face time during this encounter.   , NP

## 2021-03-17 ENCOUNTER — Encounter: Payer: Self-pay | Admitting: Nurse Practitioner

## 2021-05-08 ENCOUNTER — Telehealth: Payer: Self-pay

## 2021-05-08 NOTE — Telephone Encounter (Signed)
ATC LVM to inform pt bring physical copy of Covid test results or Covid vaccinations.

## 2021-05-09 ENCOUNTER — Ambulatory Visit: Payer: No Typology Code available for payment source | Admitting: Pulmonary Disease

## 2021-05-09 ENCOUNTER — Other Ambulatory Visit: Payer: Self-pay

## 2021-05-22 ENCOUNTER — Other Ambulatory Visit: Payer: Self-pay | Admitting: Pulmonary Disease

## 2021-05-23 LAB — SARS CORONAVIRUS 2 (TAT 6-24 HRS): SARS Coronavirus 2: NEGATIVE

## 2021-05-29 ENCOUNTER — Other Ambulatory Visit: Payer: Self-pay | Admitting: Pulmonary Disease

## 2021-05-30 LAB — SARS CORONAVIRUS 2 (TAT 6-24 HRS): SARS Coronavirus 2: NEGATIVE

## 2021-06-02 ENCOUNTER — Encounter: Payer: Self-pay | Admitting: Pulmonary Disease

## 2021-06-02 ENCOUNTER — Other Ambulatory Visit: Payer: Self-pay

## 2021-06-02 ENCOUNTER — Ambulatory Visit (INDEPENDENT_AMBULATORY_CARE_PROVIDER_SITE_OTHER): Payer: No Typology Code available for payment source | Admitting: Pulmonary Disease

## 2021-06-02 VITALS — BP 126/74 | HR 88 | Temp 97.1°F | Ht 65.0 in | Wt 197.0 lb

## 2021-06-02 DIAGNOSIS — U099 Post covid-19 condition, unspecified: Secondary | ICD-10-CM

## 2021-06-02 DIAGNOSIS — Z8616 Personal history of COVID-19: Secondary | ICD-10-CM

## 2021-06-02 DIAGNOSIS — R0602 Shortness of breath: Secondary | ICD-10-CM

## 2021-06-02 LAB — PULMONARY FUNCTION TEST
DL/VA % pred: 134 %
DL/VA: 5.76 ml/min/mmHg/L
DLCO cor % pred: 120 %
DLCO cor: 27.59 ml/min/mmHg
DLCO unc % pred: 120 %
DLCO unc: 27.59 ml/min/mmHg
FEF 25-75 Post: 4.27 L/sec
FEF 25-75 Pre: 4.14 L/sec
FEF2575-%Change-Post: 3 %
FEF2575-%Pred-Post: 141 %
FEF2575-%Pred-Pre: 136 %
FEV1-%Change-Post: 1 %
FEV1-%Pred-Post: 106 %
FEV1-%Pred-Pre: 105 %
FEV1-Post: 3.29 L
FEV1-Pre: 3.26 L
FEV1FVC-%Change-Post: 0 %
FEV1FVC-%Pred-Pre: 104 %
FEV6-%Change-Post: 0 %
FEV6-%Pred-Post: 101 %
FEV6-%Pred-Pre: 101 %
FEV6-Post: 3.84 L
FEV6-Pre: 3.83 L
FEV6FVC-%Pred-Post: 102 %
FEV6FVC-%Pred-Pre: 102 %
FVC-%Change-Post: 0 %
FVC-%Pred-Post: 99 %
FVC-%Pred-Pre: 99 %
FVC-Post: 3.84 L
FVC-Pre: 3.83 L
Post FEV1/FVC ratio: 86 %
Post FEV6/FVC ratio: 100 %
Pre FEV1/FVC ratio: 85 %
Pre FEV6/FVC Ratio: 100 %
RV % pred: 58 %
RV: 1.06 L
TLC % pred: 96 %
TLC: 5.17 L

## 2021-06-02 NOTE — Progress Notes (Signed)
Sandra Savage    [id number]    [birth year]  Primary Care Physician:King, Shana Chute, NP  Referring Physician: Barbette Merino, NP [address] [place] [place],  [place] [address]  Chief complaint: Follow-up for dyspnea  HPI: 45 year old with history of asthma, GERD, anxiety Reports developing COVID-19 infection in early 2020.  Her family tested positive for COVID after gathering.  She herself did not get tested until 1 month later when she was negative for PCR.  Subsequent COVID IgG antibodies is negative as well  Ever since this incident she has increasing dyspnea on exertion, talking, exercise.  Started on Symbicort which helps minimally with symptoms  She was previously seen in pulmonary office in 2020 with high-res CT and PFTs with no evidence of interstitial lung disease  Pets: No pets Occupation: Part-time hairdresser Exposures: No mold, hot tub, Jacuzzi.  No feather pillows or comforters Smoking history: Non-smoker Travel history: Immigrated from [place] around 2005.  No significant recent travel Relevant family history: No family history of lung disease  Interim history: Continues to have dyspnea on exertion She has reduce Symbicort to 1 puff twice daily.  She has not noted any difference with the inhaler She has gained some weight while on the medication  Here for review of PFTs.  Outpatient Encounter Medications as of 06/02/2021  Medication Sig   budesonide-formoterol (SYMBICORT) 80-4.5 MCG/ACT inhaler Inhale 2 puffs into the lungs 2 (two) times daily.   [DISCONTINUED] gabapentin (NEURONTIN) 300 MG capsule Take 1 capsule by mouth daily.   [DISCONTINUED] hydroxyurea (HYDREA) 500 MG capsule Take 500 mg by mouth 2 (two) times daily.   [DISCONTINUED] hydrOXYzine (ATARAX/VISTARIL) 25 MG tablet Take 1 tablet (25 mg total) by mouth 3 (three) times daily as needed.   [DISCONTINUED] propranolol (INDERAL) 10 MG tablet Take 1 tablet (10 mg total) by mouth 3 (three)  times daily. (Patient not taking: No sig reported)   No facility-administered encounter medications on file as of 06/02/2021.   Physical Exam: Blood pressure 126/70, pulse 81, height 5\' 5"  (1.651 m), weight 198 lb 3.2 oz (89.9 kg), last menstrual period 01/07/2021, SpO2 98 %. Gen:      No acute distress HEENT:  EOMI, sclera anicteric Neck:     No masses; no thyromegaly Lungs:    Clear to auscultation bilaterally; normal respiratory effort CV:         Regular rate and rhythm; no murmurs Abd:      + bowel sounds; soft, non-tender; no palpable masses, no distension Ext:    No edema; adequate peripheral perfusion Skin:      Warm and dry; no rash Neuro: alert and oriented x 3 Psych: normal mood and affect  Data Reviewed: Imaging: High-resolution CT 07/03/2019-no interstitial lung disease, mild atelectasis in the right middle lobe and lingula.   Chest x-ray 02/06/2021-.  No active cardiopulmonary disease I have reviewed the images personally.  PFTs: 07/16/2019 FVC 3.80 [97%], FEV1 3.25 [103%], F/F 86, DLCO 29.27 [127%] Normal test  06/02/2021 FVC 3.84 [99%], FEV1 3.29 [106%], F/F 105, TLC 5.17 [96%], DLCO 27.6 [120%] Normal test  Labs: CBC differential sick/27/22-WBC 7.5 eos 1%, absolute eosinophil count 75 IgE 6-27/2022- 2  Assessment:  Post COVID-19 Reports worsening dyspnea after suspected COVID-19 infection though she did not have a positive test.  No evidence of interstitial lung disease on prior high-res CT and PFTs  There is a question of asthma but no evidence of peripheral eosinophilia  or IgE, PFTs continue to be normal She has not seen any improvement with Symbicort and inhaled corticosteroid made her gain weight so we will discontinue it  Advised weight loss with diet and exercise which will help with her symptoms of dyspnea.  Discussed referral to pulmonary rehab for post-COVID syndrome but she would like to try and stay active at home.  Plan/Recommendations: Stop  Symbicort, weight loss with diet and exercise  Chilton Greathouse MD [facility] 06/02/2021, 2:02 PM  CC: Barbette Merino, NP

## 2021-06-02 NOTE — Patient Instructions (Signed)
I have reviewed your PFTs which show normal lung function which is reassuring Stop the Symbicort Advised weight loss with diet and exercise  Follow-up in 6 months   He revisado sus PFT que muestran una funcin pulmonar normal, lo cual es tranquilizador. Detener el Symbicort Prdida de peso aconsejada con dieta y ejercicio.  Seguimiento en 6 meses

## 2021-06-02 NOTE — Patient Instructions (Signed)
Full PFT performed today. °

## 2021-06-02 NOTE — Progress Notes (Signed)
Full PFT performed today. °

## 2021-06-24 ENCOUNTER — Other Ambulatory Visit: Payer: Self-pay

## 2021-06-24 ENCOUNTER — Ambulatory Visit
Admission: EM | Admit: 2021-06-24 | Discharge: 2021-06-24 | Disposition: A | Discharge status: Home / Self Care | Payer: No Typology Code available for payment source

## 2021-06-24 DIAGNOSIS — R1011 Right upper quadrant pain: Secondary | ICD-10-CM

## 2021-06-24 NOTE — ED Triage Notes (Signed)
Pt said x 8 months since covid she has been having abdominal pain LUQ and RUQ pain. Pt said nausea, and cramping. Did have ultra sound 3 months ago no abnormal finding.

## 2021-06-24 NOTE — Discharge Instructions (Addendum)
Please follow up with PCP as soon as possible to discuss symptoms.

## 2021-06-24 NOTE — ED Provider Notes (Signed)
[facility]    CSN: [id number] Arrival date & time: 06/24/21  1134      History   Chief Complaint Chief Complaint  Patient presents with   Abdominal Pain    HPI Sandra Savage is a 45 y.o. female.   Patient here today for evaluation of RUQ pain that started after she had covid several months ago. No worsening at this time. She does not report vomiting or diarrhea. She has not had fever. She has seen several specialists for multiple other suspected post-covid related symptoms. She does not report any treatment for symptoms.  The history is provided by the patient. The history is limited by a language barrier. A language interpreter was used Banker).  Abdominal Pain Associated symptoms: vaginal bleeding and vaginal discharge   Associated symptoms: no chills, no diarrhea, no fever, no nausea, no shortness of breath and no vomiting    Past Medical History:  Diagnosis Date   Anxiety    Coronavirus infection 02/2020   Fatty liver    Hypercholesteremia     Patient Active Problem List   Diagnosis Date Noted   Educated about COVID-19 virus infection 04/04/2020   History of 2019 novel coronavirus disease (COVID-19) 03/17/2020   GERD (gastroesophageal reflux disease) 03/17/2020   Physical deconditioning 03/17/2020   Anxiety 03/17/2020   Shortness of breath 02/04/2020   Generalized anxiety disorder 02/04/2020   Weakness of both lower extremities 02/04/2020   Chronic nonintractable headache 02/04/2020   Tremor of both hands 02/04/2020   History of viral illness 02/04/2020    Past Surgical History:  Procedure Laterality Date   APPENDECTOMY     MANDIBLE FRACTURE SURGERY     domestic     OB History   No obstetric history on file.      Home Medications    Prior to Admission medications   Medication Sig Start Date End Date Taking? Authorizing Provider  budesonide-formoterol (SYMBICORT) 80-4.5 MCG/ACT inhaler Inhale 2 puffs into the lungs 2  (two) times daily. 02/03/20   Ivonne Andrew, NP  propranolol (INDERAL) 10 MG tablet Take 1 tablet (10 mg total) by mouth 3 (three) times daily. Patient not taking: No sig reported 07/19/20 01/17/21  Kallie Locks, FNP    Family History Family History  Problem Relation Age of Onset   High blood pressure Mother     Social History Social History   Tobacco Use   Smoking status: Never   Smokeless tobacco: Never  Substance Use Topics   Alcohol use: Never   Drug use: Never     Allergies   Patient has no known allergies.   Review of Systems Review of Systems  Constitutional:  Negative for chills and fever.  Eyes:  Negative for discharge and redness.  Respiratory:  Negative for shortness of breath.   Gastrointestinal:  Positive for abdominal pain. Negative for diarrhea, nausea and vomiting.  Genitourinary:  Positive for vaginal bleeding and vaginal discharge.    Physical Exam Triage Vital Signs ED Triage Vitals [06/24/21 1417]  Enc Vitals Group     BP 125/85     Pulse Rate 75     Resp 16     Temp 97.8 F (36.6 C)     Temp Source Oral     SpO2 98 %     Weight      Height      Head Circumference      Peak Flow      Pain Score  Pain Loc      Pain Edu?      Excl. in GC?    No data found.  Updated Vital Signs BP 125/85 (BP Location: Right Arm)   Pulse 75   Temp 97.8 F (36.6 C) (Oral)   Resp 16   LMP 05/27/2021 (Exact Date)   SpO2 98%     Physical Exam Vitals and nursing note reviewed.  Constitutional:      General: She is not in acute distress.    Appearance: Normal appearance. She is not ill-appearing.  HENT:     Head: Normocephalic and atraumatic.  Eyes:     Conjunctiva/sclera: Conjunctivae normal.  Cardiovascular:     Rate and Rhythm: Normal rate.  Pulmonary:     Effort: Pulmonary effort is normal.  Abdominal:     General: Abdomen is flat. Bowel sounds are normal. There is no distension.     Palpations: Abdomen is soft.     Tenderness:  There is no abdominal tenderness. There is no guarding.  Neurological:     Mental Status: She is alert.  Psychiatric:        Mood and Affect: Mood normal.        Behavior: Behavior normal.        Thought Content: Thought content normal.     UC Treatments / Results  Labs (all labs ordered are listed, but only abnormal results are displayed) Labs Reviewed - No data to display  EKG   Radiology No results found.  Procedures Procedures (including critical care time)  Medications Ordered in UC Medications - No data to display  Initial Impression / Assessment and Plan / UC Course  I have reviewed the triage vital signs and the nursing notes.  Pertinent labs & imaging results that were available during my care of the patient were reviewed by me and considered in my medical decision making (see chart for details).   Given chronicity of symptoms without changes or worsening recommended further evaluation by PCP as she may need outpatient imaging, etc. Recent labs were normal. No concerns for acute abdomen today.   Final Clinical Impressions(s) / UC Diagnoses   Final diagnoses:  RUQ pain     Discharge Instructions      Please follow up with PCP as soon as possible to discuss symptoms.      ED Prescriptions   None    PDMP not reviewed this encounter.   Tomi Bamberger, PA-C 06/25/21 [id number]

## 2021-08-17 ENCOUNTER — Other Ambulatory Visit (HOSPITAL_COMMUNITY): Payer: No Typology Code available for payment source

## 2023-03-09 IMAGING — DX DG CHEST 2V
2 series · 2 of 2 positions shown · non-contrast
Comparison: Chest radiograph dated 03/17/2020.

CLINICAL DATA: 45-year-old female with shortness of breath.

EXAM:
CHEST - 2 VIEW

[chest pa]
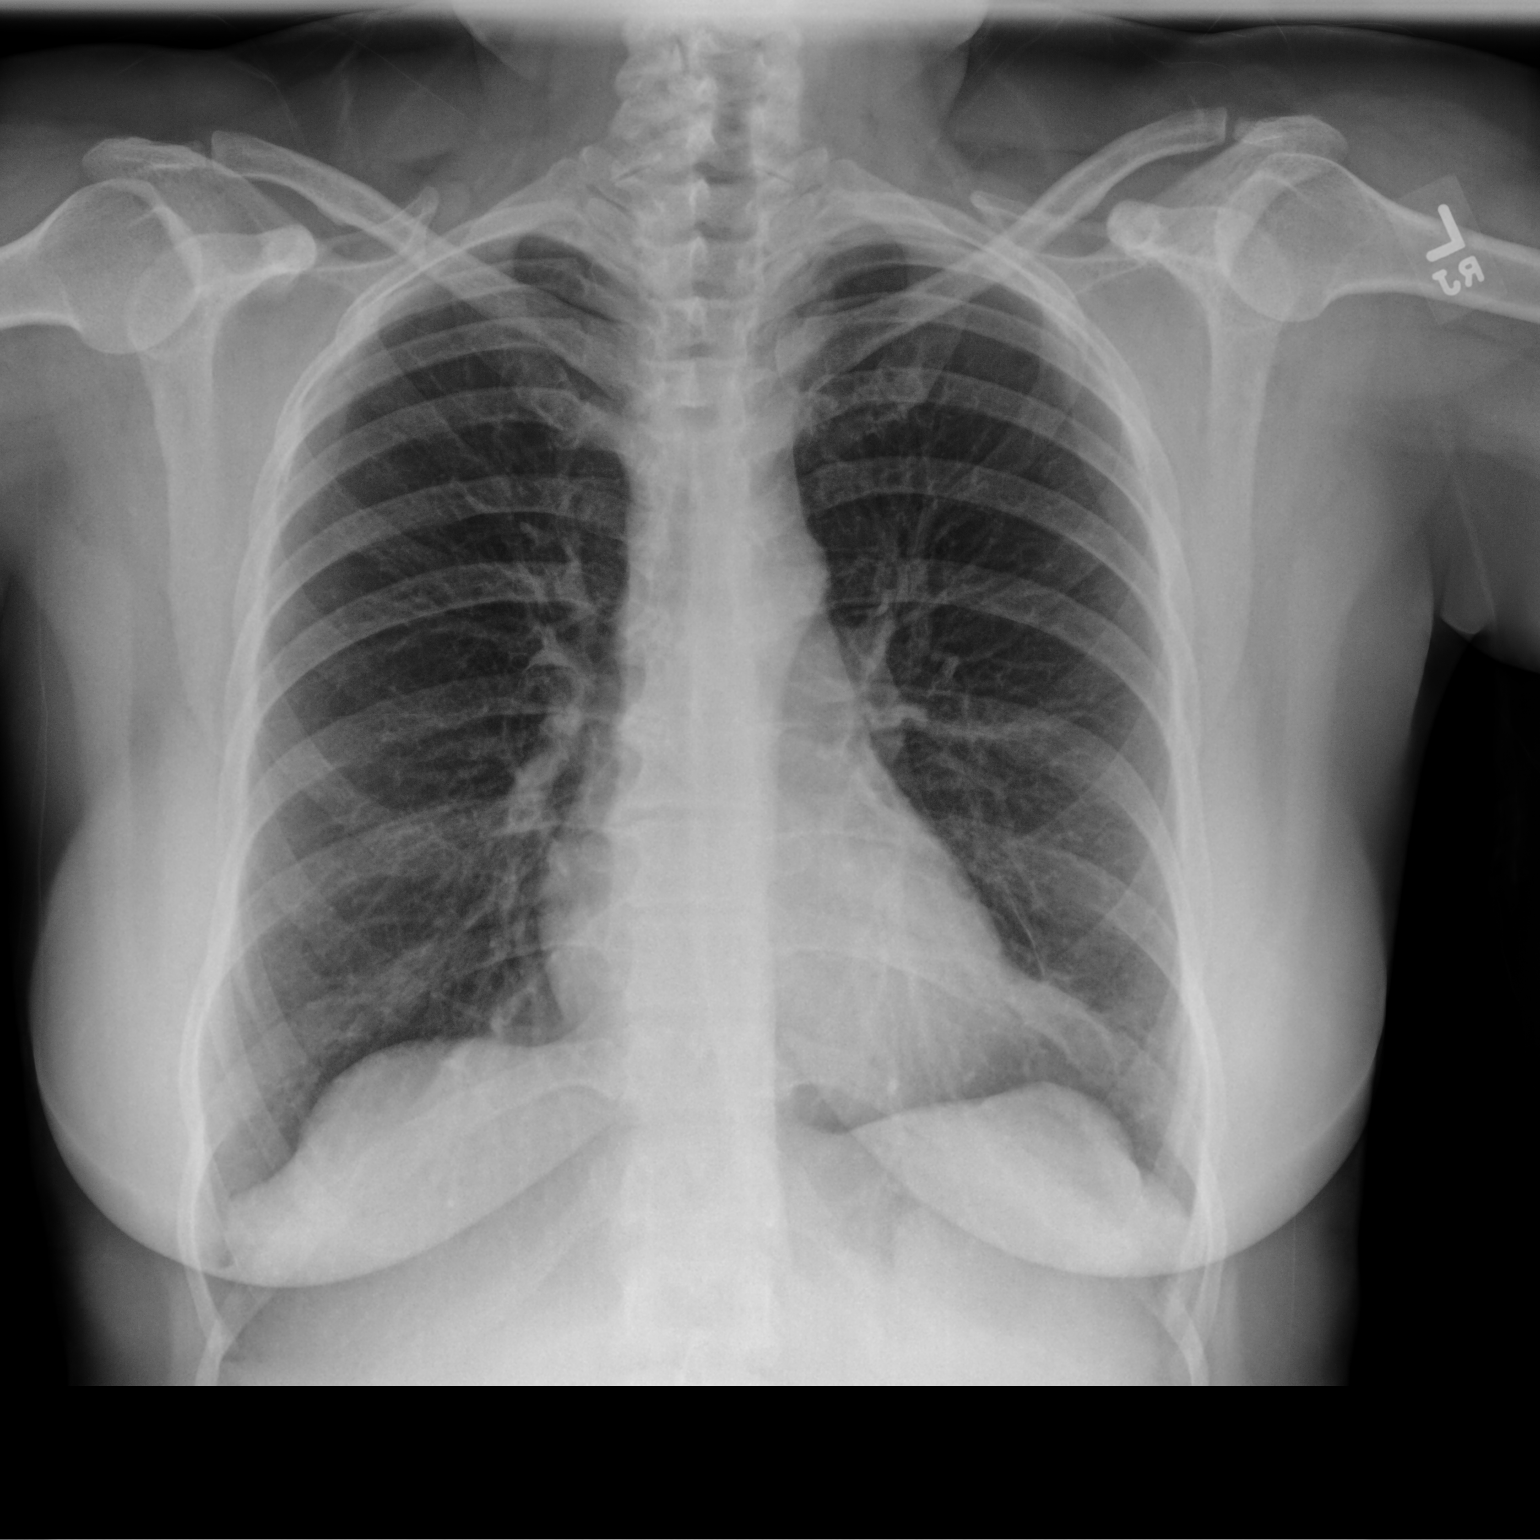

[chest lat]
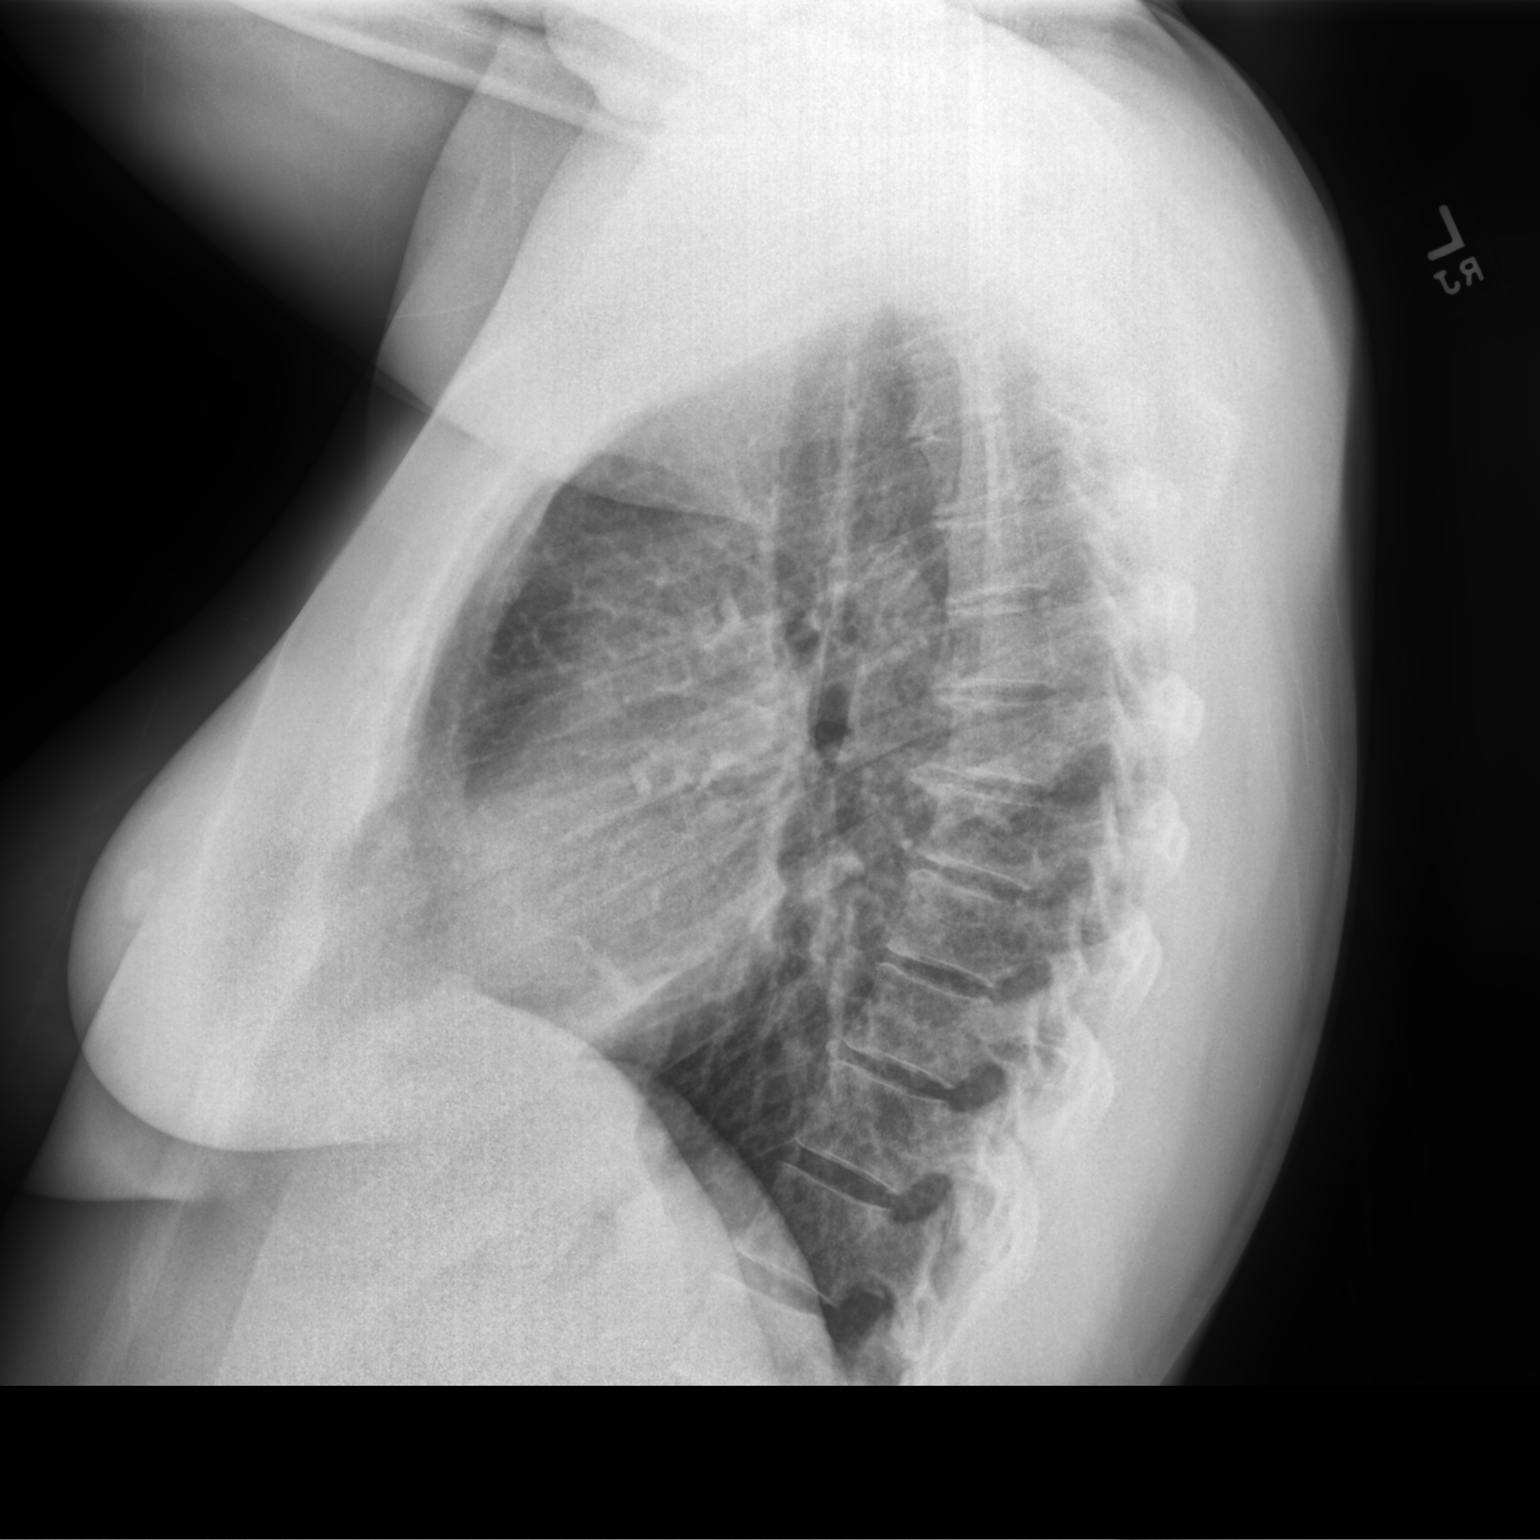

[2 of 2 positions shown; findings below may reference images not displayed]

FINDINGS: The heart size and mediastinal contours are within normal limits.
Both lungs are clear. The visualized skeletal structures are
unremarkable.
IMPRESSION: No active cardiopulmonary disease.

## 2024-03-04 ENCOUNTER — Other Ambulatory Visit (HOSPITAL_COMMUNITY): Payer: Self-pay
